# Patient Record
Sex: Female | Born: 1975
Health system: Southern US, Community
[De-identification: ages and names within clinical notes are randomized; demographics above are authoritative.]

## PROBLEM LIST (undated history)

## (undated) DIAGNOSIS — F419 Anxiety disorder, unspecified: Secondary | ICD-10-CM

## (undated) DIAGNOSIS — E669 Obesity, unspecified: Secondary | ICD-10-CM

## (undated) DIAGNOSIS — F329 Major depressive disorder, single episode, unspecified: Secondary | ICD-10-CM

## (undated) DIAGNOSIS — F32A Depression, unspecified: Secondary | ICD-10-CM

## (undated) HISTORY — DX: Major depressive disorder, single episode, unspecified: F32.9

## (undated) HISTORY — DX: Obesity, unspecified: E66.9

## (undated) HISTORY — DX: Depression, unspecified: F32.A

## (undated) HISTORY — DX: Anxiety disorder, unspecified: F41.9

---

## 1999-08-06 ENCOUNTER — Ambulatory Visit (HOSPITAL_COMMUNITY): Admission: RE | Admit: 1999-08-06 | Discharge: 1999-08-06 | Payer: Self-pay | Admitting: *Deleted

## 1999-08-06 ENCOUNTER — Encounter: Payer: Self-pay | Admitting: *Deleted

## 1999-09-04 ENCOUNTER — Inpatient Hospital Stay (HOSPITAL_COMMUNITY): Admission: AD | Admit: 1999-09-04 | Discharge: 1999-09-04 | Payer: Self-pay | Admitting: *Deleted

## 1999-12-07 ENCOUNTER — Inpatient Hospital Stay (HOSPITAL_COMMUNITY): Admission: AD | Admit: 1999-12-07 | Discharge: 1999-12-10 | Payer: Self-pay | Admitting: *Deleted

## 1999-12-07 ENCOUNTER — Encounter (INDEPENDENT_AMBULATORY_CARE_PROVIDER_SITE_OTHER): Payer: Self-pay

## 2001-11-21 ENCOUNTER — Other Ambulatory Visit: Admission: RE | Admit: 2001-11-21 | Discharge: 2001-11-21 | Payer: Self-pay | Admitting: Family Medicine

## 2002-02-28 HISTORY — PX: ABDOMINAL HYSTERECTOMY: SHX81

## 2002-02-28 HISTORY — PX: UNILATERAL SALPINGECTOMY: SHX6160

## 2002-03-13 ENCOUNTER — Encounter (INDEPENDENT_AMBULATORY_CARE_PROVIDER_SITE_OTHER): Payer: Self-pay | Admitting: Specialist

## 2002-03-13 ENCOUNTER — Observation Stay (HOSPITAL_COMMUNITY): Admission: RE | Admit: 2002-03-13 | Discharge: 2002-03-14 | Payer: Self-pay | Admitting: Gynecology

## 2004-01-05 ENCOUNTER — Ambulatory Visit: Payer: Self-pay | Admitting: Family Medicine

## 2004-02-29 HISTORY — PX: DENTAL SURGERY: SHX609

## 2004-05-31 ENCOUNTER — Ambulatory Visit: Payer: Self-pay | Admitting: Family Medicine

## 2004-10-22 ENCOUNTER — Ambulatory Visit: Payer: Self-pay | Admitting: Internal Medicine

## 2004-11-30 ENCOUNTER — Ambulatory Visit: Payer: Self-pay | Admitting: Internal Medicine

## 2004-12-14 ENCOUNTER — Ambulatory Visit: Payer: Self-pay | Admitting: Internal Medicine

## 2004-12-20 ENCOUNTER — Other Ambulatory Visit: Admission: RE | Admit: 2004-12-20 | Discharge: 2004-12-20 | Payer: Self-pay | Admitting: Gynecology

## 2005-03-22 ENCOUNTER — Ambulatory Visit: Payer: Self-pay | Admitting: Internal Medicine

## 2005-06-06 ENCOUNTER — Ambulatory Visit: Payer: Self-pay | Admitting: Family Medicine

## 2005-08-22 ENCOUNTER — Ambulatory Visit: Payer: Self-pay | Admitting: Family Medicine

## 2005-09-23 ENCOUNTER — Ambulatory Visit: Payer: Self-pay | Admitting: *Deleted

## 2005-11-01 ENCOUNTER — Emergency Department (HOSPITAL_COMMUNITY): Admission: EM | Admit: 2005-11-01 | Discharge: 2005-11-01 | Payer: Self-pay | Admitting: Emergency Medicine

## 2006-01-04 ENCOUNTER — Ambulatory Visit: Payer: Self-pay | Admitting: *Deleted

## 2006-01-11 ENCOUNTER — Ambulatory Visit: Payer: Self-pay | Admitting: *Deleted

## 2006-01-18 ENCOUNTER — Ambulatory Visit: Payer: Self-pay | Admitting: *Deleted

## 2006-01-20 ENCOUNTER — Ambulatory Visit: Payer: Self-pay | Admitting: *Deleted

## 2006-01-27 ENCOUNTER — Ambulatory Visit: Payer: Self-pay | Admitting: *Deleted

## 2006-02-08 ENCOUNTER — Ambulatory Visit: Payer: Self-pay | Admitting: *Deleted

## 2006-03-01 ENCOUNTER — Ambulatory Visit: Payer: Self-pay | Admitting: *Deleted

## 2006-03-08 ENCOUNTER — Ambulatory Visit: Payer: Self-pay | Admitting: Family Medicine

## 2006-03-08 ENCOUNTER — Ambulatory Visit: Payer: Self-pay | Admitting: *Deleted

## 2006-03-15 ENCOUNTER — Ambulatory Visit: Payer: Self-pay | Admitting: *Deleted

## 2006-10-03 ENCOUNTER — Encounter: Payer: Self-pay | Admitting: Family Medicine

## 2006-11-22 ENCOUNTER — Telehealth: Payer: Self-pay | Admitting: Infectious Diseases

## 2006-12-07 ENCOUNTER — Telehealth (INDEPENDENT_AMBULATORY_CARE_PROVIDER_SITE_OTHER): Payer: Self-pay | Admitting: *Deleted

## 2006-12-08 ENCOUNTER — Ambulatory Visit: Payer: Self-pay | Admitting: Internal Medicine

## 2006-12-08 DIAGNOSIS — F329 Major depressive disorder, single episode, unspecified: Secondary | ICD-10-CM

## 2006-12-08 DIAGNOSIS — F419 Anxiety disorder, unspecified: Secondary | ICD-10-CM

## 2006-12-11 ENCOUNTER — Telehealth (INDEPENDENT_AMBULATORY_CARE_PROVIDER_SITE_OTHER): Payer: Self-pay | Admitting: *Deleted

## 2006-12-13 ENCOUNTER — Telehealth (INDEPENDENT_AMBULATORY_CARE_PROVIDER_SITE_OTHER): Payer: Self-pay | Admitting: *Deleted

## 2006-12-19 ENCOUNTER — Telehealth (INDEPENDENT_AMBULATORY_CARE_PROVIDER_SITE_OTHER): Payer: Self-pay | Admitting: *Deleted

## 2006-12-26 ENCOUNTER — Encounter: Payer: Self-pay | Admitting: Internal Medicine

## 2006-12-27 ENCOUNTER — Encounter: Payer: Self-pay | Admitting: Internal Medicine

## 2007-01-24 ENCOUNTER — Telehealth (INDEPENDENT_AMBULATORY_CARE_PROVIDER_SITE_OTHER): Payer: Self-pay | Admitting: *Deleted

## 2007-01-29 ENCOUNTER — Encounter: Payer: Self-pay | Admitting: Family Medicine

## 2007-01-29 ENCOUNTER — Telehealth (INDEPENDENT_AMBULATORY_CARE_PROVIDER_SITE_OTHER): Payer: Self-pay | Admitting: *Deleted

## 2007-02-26 ENCOUNTER — Telehealth (INDEPENDENT_AMBULATORY_CARE_PROVIDER_SITE_OTHER): Payer: Self-pay | Admitting: *Deleted

## 2007-03-01 HISTORY — PX: CARPAL TUNNEL RELEASE: SHX101

## 2007-03-29 ENCOUNTER — Telehealth (INDEPENDENT_AMBULATORY_CARE_PROVIDER_SITE_OTHER): Payer: Self-pay | Admitting: *Deleted

## 2007-04-20 ENCOUNTER — Telehealth (INDEPENDENT_AMBULATORY_CARE_PROVIDER_SITE_OTHER): Payer: Self-pay | Admitting: *Deleted

## 2007-05-15 ENCOUNTER — Telehealth (INDEPENDENT_AMBULATORY_CARE_PROVIDER_SITE_OTHER): Payer: Self-pay | Admitting: *Deleted

## 2007-05-18 ENCOUNTER — Emergency Department (HOSPITAL_COMMUNITY): Admission: EM | Admit: 2007-05-18 | Discharge: 2007-05-18 | Payer: Self-pay | Admitting: Emergency Medicine

## 2007-06-06 ENCOUNTER — Telehealth (INDEPENDENT_AMBULATORY_CARE_PROVIDER_SITE_OTHER): Payer: Self-pay | Admitting: *Deleted

## 2007-06-06 ENCOUNTER — Encounter (INDEPENDENT_AMBULATORY_CARE_PROVIDER_SITE_OTHER): Payer: Self-pay | Admitting: *Deleted

## 2007-06-07 ENCOUNTER — Telehealth (INDEPENDENT_AMBULATORY_CARE_PROVIDER_SITE_OTHER): Payer: Self-pay | Admitting: *Deleted

## 2007-07-04 ENCOUNTER — Encounter: Payer: Self-pay | Admitting: Family Medicine

## 2007-07-09 ENCOUNTER — Ambulatory Visit: Payer: Self-pay | Admitting: Internal Medicine

## 2007-07-24 ENCOUNTER — Telehealth: Payer: Self-pay | Admitting: Internal Medicine

## 2007-07-27 ENCOUNTER — Ambulatory Visit: Payer: Self-pay | Admitting: Internal Medicine

## 2007-07-27 DIAGNOSIS — N951 Menopausal and female climacteric states: Secondary | ICD-10-CM

## 2007-08-01 ENCOUNTER — Encounter (INDEPENDENT_AMBULATORY_CARE_PROVIDER_SITE_OTHER): Payer: Self-pay | Admitting: *Deleted

## 2007-08-06 ENCOUNTER — Telehealth (INDEPENDENT_AMBULATORY_CARE_PROVIDER_SITE_OTHER): Payer: Self-pay | Admitting: *Deleted

## 2007-08-07 ENCOUNTER — Telehealth: Payer: Self-pay | Admitting: Internal Medicine

## 2007-08-15 ENCOUNTER — Ambulatory Visit: Payer: Self-pay | Admitting: Internal Medicine

## 2007-08-15 ENCOUNTER — Telehealth (INDEPENDENT_AMBULATORY_CARE_PROVIDER_SITE_OTHER): Payer: Self-pay | Admitting: *Deleted

## 2007-09-18 ENCOUNTER — Telehealth (INDEPENDENT_AMBULATORY_CARE_PROVIDER_SITE_OTHER): Payer: Self-pay | Admitting: *Deleted

## 2007-09-19 ENCOUNTER — Telehealth: Payer: Self-pay | Admitting: Internal Medicine

## 2007-09-19 ENCOUNTER — Telehealth (INDEPENDENT_AMBULATORY_CARE_PROVIDER_SITE_OTHER): Payer: Self-pay | Admitting: *Deleted

## 2007-10-02 ENCOUNTER — Telehealth (INDEPENDENT_AMBULATORY_CARE_PROVIDER_SITE_OTHER): Payer: Self-pay | Admitting: *Deleted

## 2007-10-03 ENCOUNTER — Telehealth (INDEPENDENT_AMBULATORY_CARE_PROVIDER_SITE_OTHER): Payer: Self-pay | Admitting: *Deleted

## 2007-10-04 ENCOUNTER — Ambulatory Visit: Payer: Self-pay | Admitting: Internal Medicine

## 2007-11-07 ENCOUNTER — Telehealth: Payer: Self-pay | Admitting: Internal Medicine

## 2008-05-02 ENCOUNTER — Ambulatory Visit: Payer: Self-pay | Admitting: Internal Medicine

## 2008-05-02 ENCOUNTER — Telehealth: Payer: Self-pay | Admitting: Internal Medicine

## 2008-05-05 ENCOUNTER — Telehealth: Payer: Self-pay | Admitting: Internal Medicine

## 2008-05-16 ENCOUNTER — Ambulatory Visit: Payer: Self-pay | Admitting: Internal Medicine

## 2008-05-19 ENCOUNTER — Telehealth: Payer: Self-pay | Admitting: Internal Medicine

## 2008-05-19 LAB — CONVERTED CEMR LAB
ALT: 18 units/L (ref 0–35)
Albumin: 4.4 g/dL (ref 3.5–5.2)
BUN: 7 mg/dL (ref 6–23)
Basophils Absolute: 0.1 10*3/uL (ref 0.0–0.1)
Basophils Relative: 0.7 % (ref 0.0–3.0)
Bilirubin, Direct: 0.1 mg/dL (ref 0.0–0.3)
CO2: 23 meq/L (ref 19–32)
Calcium: 9.2 mg/dL (ref 8.4–10.5)
Creatinine, Ser: 0.6 mg/dL (ref 0.4–1.2)
HCT: 41.5 % (ref 36.0–46.0)
Hemoglobin: 14.7 g/dL (ref 12.0–15.0)
Lymphs Abs: 3.8 10*3/uL (ref 0.7–4.0)
MCHC: 35.5 g/dL (ref 30.0–36.0)
Monocytes Relative: 6 % (ref 3.0–12.0)
Neutro Abs: 6 10*3/uL (ref 1.4–7.7)
RBC: 4.53 M/uL (ref 3.87–5.11)
RDW: 12.3 % (ref 11.5–14.6)
TSH: 2.66 microintl units/mL (ref 0.35–5.50)
Total Protein: 6.7 g/dL (ref 6.0–8.3)

## 2008-05-23 ENCOUNTER — Telehealth: Payer: Self-pay | Admitting: Internal Medicine

## 2008-06-02 ENCOUNTER — Telehealth (INDEPENDENT_AMBULATORY_CARE_PROVIDER_SITE_OTHER): Payer: Self-pay | Admitting: *Deleted

## 2008-06-03 ENCOUNTER — Telehealth: Payer: Self-pay | Admitting: Internal Medicine

## 2008-06-03 ENCOUNTER — Encounter: Payer: Self-pay | Admitting: *Deleted

## 2008-06-03 ENCOUNTER — Ambulatory Visit: Payer: Self-pay | Admitting: Cardiovascular Disease

## 2008-06-03 ENCOUNTER — Emergency Department (HOSPITAL_COMMUNITY): Admission: EM | Admit: 2008-06-03 | Discharge: 2008-06-03 | Payer: Self-pay | Admitting: Emergency Medicine

## 2008-06-04 ENCOUNTER — Ambulatory Visit: Payer: Self-pay | Admitting: Internal Medicine

## 2008-06-04 LAB — CONVERTED CEMR LAB
Bilirubin Urine: NEGATIVE
Glucose, Urine, Semiquant: NEGATIVE
Ketones, urine, test strip: NEGATIVE
RBC / HPF: NONE SEEN (ref <3)
Specific Gravity, Urine: 1.01
Urobilinogen, UA: 0.2
WBC, UA: NONE SEEN cells/hpf (ref <3)
pH: 5.5

## 2008-06-05 ENCOUNTER — Encounter: Payer: Self-pay | Admitting: Internal Medicine

## 2008-06-09 ENCOUNTER — Telehealth (INDEPENDENT_AMBULATORY_CARE_PROVIDER_SITE_OTHER): Payer: Self-pay | Admitting: *Deleted

## 2008-06-12 ENCOUNTER — Ambulatory Visit: Payer: Self-pay | Admitting: Internal Medicine

## 2008-06-17 ENCOUNTER — Encounter: Payer: Self-pay | Admitting: Internal Medicine

## 2008-06-17 ENCOUNTER — Ambulatory Visit: Payer: Self-pay | Admitting: Internal Medicine

## 2008-06-18 ENCOUNTER — Telehealth: Payer: Self-pay | Admitting: Internal Medicine

## 2008-06-18 ENCOUNTER — Telehealth (INDEPENDENT_AMBULATORY_CARE_PROVIDER_SITE_OTHER): Payer: Self-pay | Admitting: *Deleted

## 2008-06-19 ENCOUNTER — Encounter: Payer: Self-pay | Admitting: Internal Medicine

## 2008-06-19 ENCOUNTER — Telehealth (INDEPENDENT_AMBULATORY_CARE_PROVIDER_SITE_OTHER): Payer: Self-pay | Admitting: *Deleted

## 2008-06-20 ENCOUNTER — Telehealth: Payer: Self-pay | Admitting: Internal Medicine

## 2008-06-26 ENCOUNTER — Telehealth: Payer: Self-pay | Admitting: Internal Medicine

## 2008-09-26 ENCOUNTER — Telehealth (INDEPENDENT_AMBULATORY_CARE_PROVIDER_SITE_OTHER): Payer: Self-pay | Admitting: *Deleted

## 2008-10-28 ENCOUNTER — Telehealth (INDEPENDENT_AMBULATORY_CARE_PROVIDER_SITE_OTHER): Payer: Self-pay | Admitting: *Deleted

## 2008-10-30 ENCOUNTER — Telehealth: Payer: Self-pay | Admitting: Internal Medicine

## 2008-10-30 ENCOUNTER — Ambulatory Visit: Payer: Self-pay | Admitting: Internal Medicine

## 2008-11-28 ENCOUNTER — Telehealth (INDEPENDENT_AMBULATORY_CARE_PROVIDER_SITE_OTHER): Payer: Self-pay | Admitting: *Deleted

## 2008-12-19 ENCOUNTER — Ambulatory Visit: Payer: Self-pay | Admitting: Internal Medicine

## 2008-12-19 DIAGNOSIS — N644 Mastodynia: Secondary | ICD-10-CM

## 2008-12-22 ENCOUNTER — Ambulatory Visit: Payer: Self-pay | Admitting: Internal Medicine

## 2009-01-02 ENCOUNTER — Encounter: Admission: RE | Admit: 2009-01-02 | Discharge: 2009-01-02 | Payer: Self-pay | Admitting: Internal Medicine

## 2009-02-04 ENCOUNTER — Telehealth: Payer: Self-pay | Admitting: Family

## 2009-02-04 ENCOUNTER — Ambulatory Visit: Payer: Self-pay | Admitting: Family

## 2009-02-11 ENCOUNTER — Telehealth: Payer: Self-pay | Admitting: Internal Medicine

## 2009-03-18 ENCOUNTER — Telehealth (INDEPENDENT_AMBULATORY_CARE_PROVIDER_SITE_OTHER): Payer: Self-pay | Admitting: *Deleted

## 2009-03-30 ENCOUNTER — Telehealth: Payer: Self-pay | Admitting: Internal Medicine

## 2009-06-01 ENCOUNTER — Telehealth (INDEPENDENT_AMBULATORY_CARE_PROVIDER_SITE_OTHER): Payer: Self-pay | Admitting: *Deleted

## 2009-06-02 ENCOUNTER — Ambulatory Visit: Payer: Self-pay | Admitting: Internal Medicine

## 2009-06-02 ENCOUNTER — Telehealth (INDEPENDENT_AMBULATORY_CARE_PROVIDER_SITE_OTHER): Payer: Self-pay | Admitting: *Deleted

## 2009-06-03 ENCOUNTER — Encounter: Payer: Self-pay | Admitting: Internal Medicine

## 2009-10-14 ENCOUNTER — Ambulatory Visit: Payer: Self-pay | Admitting: Internal Medicine

## 2009-10-14 DIAGNOSIS — J019 Acute sinusitis, unspecified: Secondary | ICD-10-CM

## 2010-03-28 LAB — CONVERTED CEMR LAB
Basophils Relative: 0.7 % (ref 0.0–1.0)
CO2: 31 meq/L (ref 19–32)
Eosinophils Relative: 3.7 % (ref 0.0–5.0)
GFR calc Af Amer: 186 mL/min
Glucose, Bld: 93 mg/dL (ref 70–99)
HCT: 38 % (ref 36.0–46.0)
Hemoglobin: 13.4 g/dL (ref 12.0–15.0)
Lymphocytes Relative: 29.8 % (ref 12.0–46.0)
Monocytes Absolute: 0.5 10*3/uL (ref 0.2–0.7)
Neutro Abs: 5.7 10*3/uL (ref 1.4–7.7)
Potassium: 4.1 meq/L (ref 3.5–5.1)
WBC: 9.4 10*3/uL (ref 4.5–10.5)

## 2010-04-01 NOTE — Assessment & Plan Note (Signed)
Summary: SINUS INFECTION/KN   Vital Signs:  Patient profile:   35 year old female Menstrual status:  hysterectomy Weight:      234.13 pounds Pulse rate:   75 / minute Pulse rhythm:   regular BP sitting:   132 / 80  (left arm) Cuff size:   large  Vitals Entered By: Army Fossa CMA (October 14, 2009 10:28 AM) CC: Sinus Infection? Comments - sneezing - coughing - head congestion - Fever of 100.8 (highest)    History of Present Illness: sick for one week Sinus congestion, bloody nasal discharge, frontal headache that gets worse when she bends. Some left ear discomfort  ROS No sore throat Some cough with green sputum No shortness or breath No nausea, vomiting or diarrhea No myalgias she was seen several months ago with mastalgia, she had a negative mammogram, symptoms resolved. She does self breast exam and found no abnormalities  Current Medications (verified): 1)  Celexa 40 Mg  Tabs (Citalopram Hydrobromide) .Marland Kitchen.. 1 1/2 By Mouth Once Daily 2)  Alprazolam 1 Mg  Tabs (Alprazolam) .Marland Kitchen.. 1 By Mouth Three Times A Day As Needed Fax [phone]  Allergies: 1)  ! Codeine 2)  ! Ultram (Tramadol Hcl) 3)  ! * Contrast Dye 4)  ! Hydrocodone  Past History:  Past Medical History: Reviewed history from 06/12/2008 and no changes required. Anxiety Depression Obesity  Past Surgical History: Reviewed history from 06/12/2008 and no changes required. Hysterectomy Oophorectomy (only 1 was removed at time of hysterectomy) Carpal tunnel syndrome- surgery on the R 4-09  Physical Exam  General:  alert and well-developed.   Head:  face symmetric, tender at both maxillary and frontal sinuses bilaterally Ears:  R ear normal and L ear normal.   Nose:  congested Mouth:  no redness or discharge Lungs:  clear to auscultation bilaterally Heart:  normal rate, regular rhythm, no murmur, and no gallop.     Impression & Recommendations:  Problem # 1:  SINUSITIS - ACUTE-NOS  (ICD-461.9)  see  instructions  Her updated medication list for this problem includes:    Amoxicillin 500 Mg Caps (Amoxicillin) .Marland Kitchen... 2 capsules by mouth twice a day for 10 days    Flonase 50 Mcg/act Susp (Fluticasone propionate) .Marland Kitchen... 2 sprays daily to each side of the nose  Problem # 2:  MASTALGIA (ICD-611.71) symptoms resolved Had a mammogram 11-10, was negative  Complete Medication List: 1)  Celexa 40 Mg Tabs (Citalopram hydrobromide) .Marland Kitchen.. 1 1/2 by mouth once daily 2)  Alprazolam 1 Mg Tabs (Alprazolam) .Marland Kitchen.. 1 by mouth three times a day as needed fax [phone] 3)  Amoxicillin 500 Mg Caps (Amoxicillin) .... 2 capsules by mouth twice a day for 10 days 4)  Flonase 50 Mcg/act Susp (Fluticasone propionate) .... 2 sprays daily to each side of the nose  Patient Instructions: 1)  rest, fluids 2)  Continue taking ibuprofen and alavert as needed 3)  Mucinex DM twice a day until the cough is resolved 4)  Flonase 2 sprays in each side of the nose for 4 weeks 5)  amoxicillin as prescribed 6)  Call if you're not completely well in 2 weeks, call any time if you get worse 7)  you are due for a physical exam, please schedule at your convenience Prescriptions: FLONASE 50 MCG/ACT SUSP (FLUTICASONE PROPIONATE) 2 sprays daily to each side of the nose  #1 x 0   Entered and Authorized by:   Nolon Rod. Paz MD   Signed by:  Jose E. Paz MD on 10/14/2009   Method used:   Electronically to        [id number] (retail)       [address]       [place], [place]  [address]       Ph: [phone] or [phone]       Fax: [phone]   RxID:   [id number] AMOXICILLIN 500 MG CAPS (AMOXICILLIN) 2 capsules by mouth twice a day for 10 days  #40 x 0   Entered and Authorized by:   Nolon Rod. Paz MD   Signed by:   Nolon Rod. Paz MD on 10/14/2009   Method used:   Electronically to        [id number] (retail)       [address]       [place], [place]  [address]       Ph: [phone] or [phone]       Fax: [phone]   RxID:   [id number]

## 2010-04-01 NOTE — Progress Notes (Signed)
Summary: rf xanax  Phone Note Refill Request Call back at Home Phone [phone] Message from:  Patient on March 30, 2009 4:34 PM  Refills Requested: Medication #1:  ALPRAZOLAM 1 MG  TABS 1 by mouth three times a day as needed   Last Refilled: 12/29/2008   Notes: #45 with 6 refils [facility] [address]   Method Requested: Fax to Local Pharmacy Initial call taken by: Shary Decamp,  March 30, 2009 4:34 PM Caller: Patient  Follow-up for Phone Call        last ov 02/04/09, last refill #90 x 1 pm 01/27/09 Follow-up by: Kandice Hams,  March 30, 2009 4:43 PM  Additional Follow-up for Phone Call Additional follow up Details #1::        ok 90 and 1 RF Additional Follow-up by: Jose E. Paz MD,  March 30, 2009 5:03 PM    Prescriptions: ALPRAZOLAM 1 MG  TABS (ALPRAZOLAM) 1 by mouth three times a day as needed  #90 x 1   Entered by:   Shary Decamp   Authorized by:   Nolon Rod. Paz MD   Signed by:   Shary Decamp on 03/30/2009   Method used:   Printed then faxed to ...       [facility]  [address] [id number] (retail)       [address]       [place], [place]  [address]       Ph: [phone] or [phone]       Fax: [phone]   RxID:   [id number]

## 2010-04-01 NOTE — Progress Notes (Signed)
  Phone Note Call from Patient Call back at [facility] Phone [phone]   Caller: Patient Summary of Call: pt called left msg wanted a called back. Called  pt says she had vomiting last thurs, which is gone now , but diarrhea now x 4 days, Pt is drinking fluids, wanted to know what to do for diarrhea. Recommed Imodium AD if no better may need ov, pt agreed .Kandice Hams  March 18, 2009 12:27 PM  Initial call taken by: Kandice Hams,  March 18, 2009 12:27 PM

## 2010-04-01 NOTE — Progress Notes (Signed)
Summary: XRAY REQUEST  Phone Note Call from Patient Call back at Home Phone [phone]   Caller: Patient Call For: [place] E. Paz MD Summary of Call: PATIENT CALLING, STATES SHE IS SURE SHE HAS BROKEN HER LEFT FIRST BIG TOE.  UNABLE TO COME IN FOR OV TODAY, BUT WANTS TO KNOW IF DR. PAZ WILL SEND HER FOR AN XRAY. Initial call taken by: Magdalen Spatz [facility],  June 01, 2009 11:07 AM  Follow-up for Phone Call        pt was walking down steps and slipper got stuck under toe, now unable to walk flat. Informed pt will need ov to assess. ov scheduled .Kandice Hams  June 01, 2009 11:56 AM  Follow-up by: Kandice Hams,  June 01, 2009 11:56 AM

## 2010-04-01 NOTE — Progress Notes (Signed)
Summary: itching  Phone Note Call from Patient Call back at [facility] Phone [phone]   Summary of Call: Took 1 vicodin -- c/o of severe itching Advised pt to take benadryl, hydrocodone added to allergy list rx something else? Shary Decamp  June 02, 2009 11:42 AM   Follow-up for Phone Call        --will have to stick to Tylenol 500 mg two tablets every 6 hours as needed -- she does have a fracture, joint is involved, please refer to ortho; until then, she needs to use very hard/sturdy shoes or come back and pick up a postop shoe Follow-up by: Nolon Rod. Paz MD,  June 02, 2009 3:03 PM  Additional Follow-up for Phone Call Additional follow up Details #1::        discussed with pt Shary Decamp  June 02, 2009 4:41 PM   New Problems: FRACTURE, FOOT (ICD-825.20)   New Problems: FRACTURE, FOOT (ICD-825.20)

## 2010-04-01 NOTE — Assessment & Plan Note (Signed)
Summary: RIGHT BIG TOE BROKEN?/ALR   Vital Signs:  Patient profile:   35 year old female Menstrual status:  hysterectomy Height:      67 inches Weight:      242 pounds Pulse rate:   70 / minute BP sitting:   120 / 80  Vitals Entered By: Shary Decamp (June 02, 2009 9:21 AM) CC: fell down steps 5 days ago, injury to left great toe, foot   History of Present Illness: 5 days ago she was going down the stairs, she tripped and landed on her left big toe,she hyperflexed it  complaining of moderate to severe pain swelling is about the same since the accident  Current Medications (verified): 1)  Celexa 40 Mg  Tabs (Citalopram Hydrobromide) .Marland Kitchen.. 1 1/2 By Mouth Once Daily 2)  Alprazolam 1 Mg  Tabs (Alprazolam) .Marland Kitchen.. 1 By Mouth Three Times A Day As Needed  Allergies (verified): 1)  ! Codeine 2)  ! Ultram (Tramadol Hcl) 3)  ! * Contrast Dye 4)  ! Hydrocodone  Past History:  Past Medical History: Reviewed history from 06/12/2008 and no changes required. Anxiety Depression Obesity  Past Surgical History: Reviewed history from 06/12/2008 and no changes required. 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 lost father Carlton Adam one of my patients) aprox 2-11  Review of Systems       denies any other injuries she lost her father few weeks ago, still grieving, she is counseled  Physical Exam  General:  alert and well-developed.   Extremities:  --right foot normal --left foot good capillary refill swelling and tenderness at the L great toe, some ecchymoses, no deformity   Impression & Recommendations:  Problem # 1:  FOOT PAIN, LEFT (ICD-729.5) suspect left great toe fracture, will do an x-ray, depending on the results she may need to see ortho prescribe Vicodin for pain  control, in the past she tolerated it well although sometimes has itchiness with Vicodin.  Advised to let me know if she has any severe reaction Orders: T-Foot Left Min 3 Views (73630TC)  Problem # 2:  DEPRESSION (ICD-311) also lost her father not long ago, counseled Her updated medication list for this problem includes:    Celexa 40 Mg Tabs (Citalopram hydrobromide) .Marland Kitchen... 1 1/2 by mouth once daily    Alprazolam 1 Mg Tabs (Alprazolam) .Marland Kitchen... 1 by mouth three times a day as needed  Complete Medication List: 1)  Celexa 40 Mg Tabs (Citalopram hydrobromide) .Marland Kitchen.. 1 1/2 by mouth once daily 2)  Alprazolam 1 Mg Tabs (Alprazolam) .Marland Kitchen.. 1 by mouth three times a day as needed 3)  Hydrocodone-acetaminophen 7.5-750 Mg Tabs (Hydrocodone-acetaminophen) .Marland Kitchen.. 1 by mouth three times a day as needed Prescriptions: HYDROCODONE-ACETAMINOPHEN 7.5-750 MG TABS (HYDROCODONE-ACETAMINOPHEN) 1 by mouth three times a day as needed  #30 x 0   Entered and Authorized by:   Elita Quick E. Anahita Cua MD   Signed by:   Nolon Rod. Dehaven Sine MD on 06/02/2009   Method used:   Print then Give to Patient   RxID:   [id number]

## 2010-04-01 NOTE — Consult Note (Signed)
Summary: [facility]  [facility]   Imported By: Lanelle Bal 06/10/2009 13:41:44  _____________________________________________________________________  External Attachment:    Type:   Image     Comment:   External Document

## 2010-07-16 NOTE — Op Note (Signed)
NAME:  Kylie Munoz, Kylie Munoz NO.:  [id number]   MEDICAL RECORD NO.:  [id number]                   PATIENT TYPE:  OBV   LOCATION:  0479                                 FACILITY:  [facility]   PHYSICIAN:  Gretta Cool, M.D.              DATE OF BIRTH:  12/20/1975   DATE OF PROCEDURE:  DATE OF DISCHARGE:                                 OPERATIVE REPORT   PREOPERATIVE DIAGNOSES:  1. Incapacitating cyclic pelvic pain.  2. Abnormal uterine bleeding.   POSTOPERATIVE DIAGNOSES:  1. Incapacitating cyclic pelvic pain.  2. Abnormal uterine bleeding.  3. Surgically absent right tube and ovary from previous procedure.  4. Endometriosis involving the cesarean section scar.   PROCEDURE:  1. Diagnostic laparoscopy.  2. Laparoscopically assisted vaginal hysterectomy.  3. Lysis of significant adhesions.   SURGEON:  Gretta Cool, M.D.   ASSISTANT:  Raynald Kemp, M.D.   ANESTHESIA:  General orotracheal.   DESCRIPTION OF PROCEDURE:  Under excellent general anesthesia with the  patient prepped and draped in Allen stirrups with Hulka tenaculum applied to  cervix and the bladder drained, a subumbilical incision was made and Veress  cannula introduced.  After adequate pneumoperitoneum the laparoscope trocar  was introduced and pelvic organs visualized.  There were significant  adhesions from her previous cesarean section.  Careful examination revealed  previous tubal sterilization on the left with a prominent left ovary  polycystic in appearance.  The right ovary was surgically absent as was the  fallopian tube.  There were adhesions all over the anterior wall of the  uterus at the site of previous cesarean section all the way to the round  ligaments.  These adhesions were lysed by tripolar forceps.  Two accessory  ports were placed under direct vision.  There was some bleeding at the right  port that was controlled by deep suture in the fascia.  The pedicles were  then transected from the adnexal pedicles to the level of the uterine  vessels by tripolar forceps.  On the left the ovarian ligament was  transected and dissection was carried to the level of the uterine vessels.  The vesicovaginal plica was dissected from above.  At this point with  bleeding well controlled, attention was turned to the vaginal portion of  procedure.  Cervix was grasped with a single tooth tenaculum, pulled down in  view.  Was infiltrated with Xylocaine with epinephrine 1:200,000.  The  mucosa was then incised and the bladder pushed off the lower segment.  The  cul-de-sac was then entered and the uterosacral ligaments clamped, cut,  sutured, and tied with 0 Vicryl.  The cardinal ligaments were then clamped,  cut, sutured, and tied with 0 Vicryl.  The uterine vessels were then  skeletonized, clamped, cut, sutured, and tied with 0 Vicryl.  At this point  the pedicles were communicated with the previous abdominal  laparoscopic  portion of the procedure.  The pedicle was then tied and the uterus removed.  At this point the pedicles were examined and were all clean and dry.  The  pelvic peritoneum was then closed with a running suture of number 0  Monocryl.  The cardinal and uterosacral complex was then secured with 0  Ethibond to the anterior/posterior vaginal mucosa and to the angle of the  vagina.  The fascia was then approximated with a running suture of 2-0  Vicryl so as to approximate the entire end of pelvic fascia and create a  complete envelope with fascia, three at a time, across the apex to the  vaginal cuff.  At this point the bleeding was well controlled.  The  reexamination of the cuff as above revealed no evidence of any vaginal  bleeding.  The pelvis was irrigated with lactated Ringer's and approximately  500 cubic centimeters left in the pelvis.  The incisions were then closed  with deep suture of 5-0 Dexon and skin closed with Steri-Strips.  At the end  of the  procedure sponge and lap counts were correct.  There were no  complications.  The patient returned to recovery room in excellent  condition.   ADDENDUM:  The right ovary and fallopian tube could not be identified and  there was evidence of previous resection at a previous procedure.                                               Gretta Cool, M.D.    CWL/MEDQ  D:  03/13/2002  T:  03/13/2002  Job:  [id number]

## 2010-07-16 NOTE — H&P (Signed)
[facility]  Patient:    Kylie Munoz, Kylie Munoz                        MRN: [id number] Adm. Date:  12/07/99 Attending:  Deniece Ree, M.D.                         History and Physical  CHIEF COMPLAINT:              The patient is a 35 year old gravida 2 para 1 female, whose estimated date of confinement is December 14, 1999 by ultrasound and December 15, 1999 by dates.  HISTORY OF PRESENT ILLNESS:   The patient came to triage, at which time it was noted that she had rupture of membranes.  At the time of evaluation the patient was thought to be fingertip dilated, approximately 50% effaced, with vertex at -2 station. DD:  12/07/99 TD:  12/08/99 Job: [id number]

## 2010-07-16 NOTE — Op Note (Signed)
[facility]  Patient:    Kylie Munoz, Kylie Munoz                         MRN: [id number] Proc. Date: 12/07/99 Adm. Date:  [id number] Attending:  Deniece Ree                           Operative Report  PREOPERATIVE DIAGNOSIS:  Intrauterine pregnancy at term; breech presentation; multiparity, desire for permanent sterilization.  POSTOPERATIVE DIAGNOSIS:  Intrauterine pregnancy at term, breech presentation, multiparity, desire for permanent sterilization.  Viable female infant with Apgar of 9 at nine.  OPERATION:  Primary low transverse cesarean section; bilateral tubal ligation.  SURGEON:  Deniece Ree, M.D.  ANESTHESIA:  Epidural.  ESTIMATED BLOOD LOSS:  350 cc.  INDICATIONS:  The patient is a 35 year old gravida 2, para 1 who is admitted with rupture of membranes, who in the course of labor dilated to 5 cm and on re-examination was noted to have a breech presentation.  The estimated fetal weight at this time was at least 8 pounds.  She was then scheduled for a primary cesarean section.  PEDIATRICS:  Dr. Mikle Bosworth, Teaching Service.  DRAINS:  Foley left to straight drainage.  The patient tolerated the procedure well and returned to the recovery room in satisfactory condition.  DESCRIPTION OF PROCEDURE:  The patient was taken to the operating room and prepped and draped in the usual fashion for a cesarean section.  A low Pfannenstiel incision was made.  This was carried down to the fascia, at which time the fascia was entered and excised the extent of the incision.  The midline identified and rectus muscle separated.  The abdominal peritoneum was then entered in a vertical fashion using Metzenbaum scissors.  The visceral peritoneum was then excised bilaterally toward the round ligaments, following which the lower uterine segment was scored, entered in the midline and bluntly dissected open.  The right hand was introduced and a breech deliver was  then carried out without any difficulty.  The nasopharynx was then sucked out with a suction bulb.  The cord was clamped and the infant turned over to the pediatricians who were in attendance.  Cord blood was then obtained following which the placenta as well as all products of conception was then manually removed from the uterine cavity.  IV Pitocin as well as IV antibiotics were then begun.  The myometrium was then closed using #1 chromic in a running locking stitch followed by an imbricating stitch again using #1 chromic.  Reperitonealization was then carried out using 2-0 chromic in a running stitch.  At this time sponge and needle count was correct x 2 and hemostasis was present.  The left tube was then grasped with a Babcock clamp and followed out until the fimbriated end could be identified.  It was then knuckled up utilizing 0 plain catgut ligated in a modified Pomeroy procedure. This was done likewise on the right side.  Both tubal segment were later sent to pathology.  Both tubal stump areas were then cauterized with the use of cautery.  Again, hemostasis was present.  Sponge and needle count was correct x 2.  Ovaries appeared to be within normal limits.  The abdominal peritoneum was then closed with a 2-0 chromic in a running stitch followed by closure of the fascia using #1 Vicryl in a running stitch.  The skin was closed with skin  staples.  Procedure terminated.  The patient tolerated the procedure well and returned to the recovery room in satisfactory condition. DD:  12/07/99 TD:  12/09/99 Job: [id number]

## 2010-07-16 NOTE — H&P (Signed)
NAME:  Kylie Munoz, YARBRO NO.:  [id number]   MEDICAL RECORD NO.:  [id number]                   PATIENT TYPE:  OBV   LOCATION:  0479                                 FACILITY:  [facility]   PHYSICIAN:  Gretta Cool, M.D.              DATE OF BIRTH:  1975-08-28   DATE OF ADMISSION:  03/13/2002  DATE OF DISCHARGE:                                HISTORY & PHYSICAL   HISTORY OF PRESENT ILLNESS:  This is a 35 year old, gravida 2, para 2, with  history of cesarean section delivered for breech in 2001, and previous  vaginal delivery, now having incapacitating dysmenorrhea, premenstrual.  Also having extremely heavy menstrual bleeding with clots.  She has tried  conservative measures without significant benefit.  She wishes definitive  therapy by hysterectomy and salpingectomy.  She has declined other options  including hysteroscopy resection.  We discussed alternative therapies,  risks, benefits, and ratio involved.   PAST MEDICAL HISTORY:  Usual childhood disease without sequela.  Medical  illnesses:  None of consequence.  Accidents/injuries:  None.  Previous  hospitalizations:  Treated for gonorrhea age 64, cesarean section delivery  and one vaginal delivery 1997.  She also has a previous history of Chlamydia  and bacterial vaginosis.  No other significant medical history.   FAMILY HISTORY:  Mother and father are living, but with multiple medical  problems that she does not elaborate, but including arthritis, thyroid  problems, hypercholesterolemia, blood pressure and heart problems.   SOCIAL HISTORY:  Gravida 2, para 2 with one stepchild that is living with  her husband.  She is not employed outside the home.  He works for a [employer].   HABITS:  Denies ethanol.  Occasional tobacco use.  Denies recreational  drugs.   REVIEW OF SYSTEMS:  HEENT:  Denies __________.  RESPIRATORY:  Denies asthma,  cough, bronchitis, shortness of breath.   GASTROINTESTINAL/ GENITOURINARY:  Denies frequency, urgency, dysuria, change in bowel habits, food  intolerance.   PHYSICAL EXAMINATION:  GENERAL:  Well-developed, well-nourished white  female, but massively over ideal weight with high abdomen to hip ratio.  She  weighs 205 at 5 feet 6 inches.  HEENT:  Pupils are equal, round, and reactive to light and accommodation.  Fundi not examined.  Oropharynx clear.  NECK:  Supple without mass, thyroid enlargement.  CHEST:  Clear P:A.  HEART:  Regular rhythm without murmur, cardiac enlargement.  BREASTS:  Without mass, nodes, nipple discharge.  PELVIC:  External genitalia normal female.  Vagina clean, rugose.  Cervix is  parous, clean.  Uterus is upper limits normal size.  Adnexa clear.  Rectovaginal confirms.  There is moderate tenderness at the lower uterine  segment.  No masses.  EXTREMITIES:  Negative.  NEUROLOGICAL:  Physiologic.   IMPRESSION:  1. Incapacitating cyclic pelvic pain.  2. Menorrhagia.  3. History of tubal sterilization.  4. History  of gonorrhea/Chlamydia.   RECOMMENDATIONS:  I have discussed all alternative therapies including  hysteroscopy resection, ablation, oral contraceptives for suppression of her  abnormal bleeding and hopefully pain, not successful in the past.  I  discussed all options and alternatives, risks and benefits and she wishes to  proceed to definitive therapy recommendations on her plan.  On to  laparoscopy assisted vaginal hysterectomy, possible abdominal hysterectomy  and salpingo-oophorectomy.                                               Gretta Cool, M.D.    CWL/MEDQ  D:  03/13/2002  T:  03/13/2002  Job:  [id number]

## 2010-07-16 NOTE — Discharge Summary (Signed)
[facility]  Patient:    Kylie Munoz, Kylie Munoz                         MRN: [id number] Adm. Date:  [id number] Disc. Date: [id number] Attending:  Deniece Ree                           Discharge Summary  SUMMARY:                      Patient is a 35 year old female who was admitted and who was in active labor.  The patient had ruptured membranes at the time of admission.  On evaluation it was found that patient had a breech presentation.  The patient was also desiring permanent sterilization and therefore at that time a cesarean section was performed.  On October 9 patient underwent a primary low transverse cesarean section and a bilateral tubal ligation.  This patient tolerated this procedure very well without any problems.  Postoperatively she did good without any problems and was discharged on the third postoperative day.  She was instructed on the possible complications than can occur from this type of surgery.  She was told to return to my office in four weeks for follow-up evaluation or call me prior to that time should problems arise. DD:  01/19/00 TD:  01/21/00 Job: [id number]

## 2010-07-16 NOTE — H&P (Signed)
[facility]  Patient:    Kylie Munoz, Kylie Munoz                         MRN: [id number] Adm. Date:  [id number] Attending:  Deniece Ree                         History and Physical  CHIEF COMPLAINT:              The patient is a 35 year old gravida 2 para 1, whose estimated date of confinement by ultrasound is December 14, 1999 and by dates December 15, 1999.  HISTORY OF PRESENT ILLNESS:   The patient came to triage complaining of leakage of fluid, at which time she was evaluated and noted to have rupture of membranes.  The patient was having an occasional uterine contraction.  PAST MEDICAL HISTORY:         The patient has had one previous vaginal delivery.  The patient also is very adamant about having a tubal ligation. All of the different types of contraceptives was explained to her, which she understands, and along with her husband, and they do not wish any further pregnancies.  She also understands that this procedure for tubal ligation is intended to be permanent; however, cannot be guaranteed.  ALLERGIES:                    CODEINE.  PHYSICAL EXAMINATION:  GENERAL:                      Well-developed, well-nourished female, in no acute distress.  HEENT:                        Within normal limits.  NECK:                         Supple.  BREAST:                       Without masses, tenderness, or discharge.  CHEST:                        Lungs clear to auscultation and percussion.  HEART:                        Normal sinus rhythm without murmurs, rubs, or gallops.  ABDOMEN:                      Obese, term gravid, with fetal heart beat 132-148 in the left mid quadrant.  EXTREMITIES:                  Within normal limits.  NEUROLOGIC:                   Within normal limits.  PELVIC:                       Pelvic examination at this time revealed the cervix to be fingertip dilated, very difficult to identify the presenting part; however,  it was at -2 station.  PLAN:                         The plan was for admission. DD:  12/07/99  TD:  12/08/99 Job: [id number]

## 2010-08-02 ENCOUNTER — Other Ambulatory Visit: Payer: Self-pay | Admitting: Internal Medicine

## 2010-08-03 NOTE — Telephone Encounter (Signed)
Ok 90, no RF Also, advise pt: due for ROV, no further RF w/o OV

## 2011-08-31 ENCOUNTER — Ambulatory Visit (INDEPENDENT_AMBULATORY_CARE_PROVIDER_SITE_OTHER): Payer: BC Managed Care – PPO | Admitting: Internal Medicine

## 2011-08-31 VITALS — BP 122/78 | HR 63 | Temp 97.9°F | Ht 66.0 in | Wt 204.0 lb

## 2011-08-31 DIAGNOSIS — Z0000 Encounter for general adult medical examination without abnormal findings: Secondary | ICD-10-CM

## 2011-08-31 LAB — CBC WITH DIFFERENTIAL/PLATELET
Basophils Absolute: 0 10*3/uL (ref 0.0–0.1)
Eosinophils Relative: 3.1 % (ref 0.0–5.0)
HCT: 40.6 % (ref 36.0–46.0)
Hemoglobin: 13.8 g/dL (ref 12.0–15.0)
Lymphocytes Relative: 39.9 % (ref 12.0–46.0)
Lymphs Abs: 3.1 10*3/uL (ref 0.7–4.0)
Monocytes Relative: 4.6 % (ref 3.0–12.0)
Neutro Abs: 4.1 10*3/uL (ref 1.4–7.7)
Platelets: 208 10*3/uL (ref 150.0–400.0)
RDW: 12.9 % (ref 11.5–14.6)
WBC: 7.9 10*3/uL (ref 4.5–10.5)

## 2011-08-31 LAB — LIPID PANEL
Cholesterol: 253 mg/dL — ABNORMAL HIGH (ref 0–200)
HDL: 51.6 mg/dL (ref >39.00)
Total CHOL/HDL Ratio: 5
VLDL: 29 mg/dL (ref 0.0–40.0)

## 2011-08-31 LAB — COMPREHENSIVE METABOLIC PANEL
CO2: 26 mEq/L (ref 19–32)
Calcium: 9.2 mg/dL (ref 8.4–10.5)
Chloride: 106 mEq/L (ref 96–112)
Creatinine, Ser: 0.6 mg/dL (ref 0.4–1.2)
GFR: 122.91 mL/min (ref >60.00)
Glucose, Bld: 99 mg/dL (ref 70–99)
Total Bilirubin: 0.6 mg/dL (ref 0.3–1.2)

## 2011-08-31 LAB — LDL CHOLESTEROL, DIRECT: Direct LDL: 174.7 mg/dL

## 2011-08-31 MED ORDER — VARENICLINE TARTRATE 0.5 MG X 11 & 1 MG X 42 PO MISC: ORAL | Status: AC

## 2011-08-31 MED ORDER — VARENICLINE TARTRATE 1 MG PO TABS: 1.0000 mg | ORAL_TABLET | Freq: Two times a day (BID) | ORAL | Status: AC

## 2011-08-31 NOTE — Progress Notes (Signed)
  Subjective:    Patient ID: Huel Cote, female    DOB: 03-12-1975, 36 y.o.   MRN: [id number]  HPI CPX  Past Medical History: H/o Anxiety, Depression Obesity  Past Surgical History: Hysterectomy, Oophorectomy (only 1 was removed at time of hysterectomy) Carpal tunnel syndrome- surgery on the R 4-09  Family History: DM - F, M CAD - F (dx in his 72s) , M (dx in her 88s) HTN - F, M hyperlipidemia - M, F hypothyroid - M, F COPD - F breast Ca - M colon ca--no    Fibromyalgia - M  Social History: Married,accupation waitress,    3 kids Tobacco-- < 1ppd ETOH-- socially Diet- exercise---> improving  In the last few months    Review of Systems Emotionally doing okay, in the past she took SSRIs some alprazolam, currently not needing any medication. Denies any chest pain or shortness of breath No nausea, vomiting or diarrhea.     Objective:   Physical Exam  General -- alert, well-developed, and slt overweight appearing. No apparent distress.  Neck --no thyromegaly Lungs -- normal respiratory effort, no intercostal retractions, no accessory muscle use, and normal breath sounds.   Heart-- normal rate, regular rhythm, no murmur, and no gallop.   Abdomen--soft, non-tender, no distention, no masses, no HSM, no guarding, and no rigidity.   Extremities-- no pretibial edema bilaterally  Neurologic-- alert & oriented X3 and strength normal in all extremities. Psych-- Cognition and judgment appear intact. Alert and cooperative with normal attention span and concentration.  not anxious appearing and not depressed appearing.      Assessment & Plan:

## 2011-08-31 NOTE — Assessment & Plan Note (Addendum)
Tdap today Patient has a family history of heart disease, EKG today nsr  She smokes, she is motivated to quit, she wonders about Chantix, I think is a good option. How to take it, pros and cons discussed. Prescriptions issue. She has improved her diet and exercise lately, praised. Needs a referral to gynecology, refer to Dr. Lily Peer. She is taking a number of supplements including a colon cleanser, pt wonders if  is safe. I review the colon cleanser label I didn't see anything dangerous, I told her to take it for a limited amount of time.

## 2011-09-02 ENCOUNTER — Encounter: Payer: Self-pay | Admitting: Internal Medicine

## 2011-09-05 ENCOUNTER — Encounter: Payer: Self-pay | Admitting: Internal Medicine

## 2011-09-12 ENCOUNTER — Ambulatory Visit: Payer: BC Managed Care – PPO | Admitting: Gynecology

## 2011-09-20 ENCOUNTER — Encounter: Payer: Self-pay | Admitting: Gynecology

## 2011-09-20 ENCOUNTER — Ambulatory Visit (INDEPENDENT_AMBULATORY_CARE_PROVIDER_SITE_OTHER): Payer: BC Managed Care – PPO | Admitting: Gynecology

## 2011-09-20 ENCOUNTER — Ambulatory Visit: Payer: BC Managed Care – PPO | Admitting: Gynecology

## 2011-09-20 ENCOUNTER — Other Ambulatory Visit (HOSPITAL_COMMUNITY)
Admission: RE | Admit: 2011-09-20 | Discharge: 2011-09-20 | Disposition: A | Discharge status: Home / Self Care | Payer: BC Managed Care – PPO | Source: Ambulatory Visit | Attending: Gynecology | Admitting: Gynecology

## 2011-09-20 VITALS — BP 120/70 | Ht 66.25 in | Wt 208.0 lb

## 2011-09-20 DIAGNOSIS — R87810 Cervical high risk human papillomavirus (HPV) DNA test positive: Secondary | ICD-10-CM | POA: Insufficient documentation

## 2011-09-20 DIAGNOSIS — Z01419 Encounter for gynecological examination (general) (routine) without abnormal findings: Secondary | ICD-10-CM | POA: Insufficient documentation

## 2011-09-20 DIAGNOSIS — N951 Menopausal and female climacteric states: Secondary | ICD-10-CM

## 2011-09-20 DIAGNOSIS — Z803 Family history of malignant neoplasm of breast: Secondary | ICD-10-CM

## 2011-09-20 NOTE — Progress Notes (Signed)
Kylie Munoz [birth year] [id number]   History:    36 y.o.  for annual gyn exam new patient to the practice. She does not recall when her last Pap smear was. She states she does have normal Pap smears in the past. And 2004 she had an abdominal hysterectomy. She was informed that she was missing her right ovary. Patient does not recall ever having had her right tube and ovary removed. She did have a tubal ligation prior to that. Patient does complain of some vasomotor symptoms. Patient frequently does her self breast examination. Her last mammogram was in 2010. Patient has a very strong family history of breast cancer whereby her mother had breast cancer and grandmother's on both sides of the family. Patient is a chronic smoker.  Past medical history,surgical history, family history and social history were all reviewed and documented in the EPIC chart.  Gynecologic History Patient's last menstrual period was 03/13/2002. Contraception: none Last Pap: ?Marland Kitchen Results were: No prior abnormal Pap smears Last mammogram: 2010. Results were: normal  Obstetric History OB History    Grav Para Term Preterm Abortions TAB SAB Ect Mult Living   2         2     # Outc Date GA Lbr Len/2nd Wgt Sex Del Anes PTL Lv   1 GRA            2 GRA                ROS: A ROS was performed and pertinent positives and negatives are included in the history.  GENERAL: No fevers or chills. HEENT: No change in vision, no earache, sore throat or sinus congestion. NECK: No pain or stiffness. CARDIOVASCULAR: No chest pain or pressure. No palpitations. PULMONARY: No shortness of breath, cough or wheeze. GASTROINTESTINAL: No abdominal pain, nausea, vomiting or diarrhea, melena or bright red blood per rectum. GENITOURINARY: No urinary frequency, urgency, hesitancy or dysuria. MUSCULOSKELETAL: No joint or muscle pain, no back pain, no recent trauma. DERMATOLOGIC: No rash, no itching, no lesions. ENDOCRINE: No polyuria, polydipsia, no heat  or cold intolerance. No recent change in weight. HEMATOLOGICAL: No anemia or easy bruising or bleeding. NEUROLOGIC: No headache, seizures, numbness, tingling or weakness. PSYCHIATRIC: No depression, no loss of interest in normal activity or change in sleep pattern.     Exam: chaperone present  BP 120/70  Ht 5' 6.25" (1.683 m)  Wt 208 lb (94.348 kg)  BMI 33.32 kg/m2  LMP 03/13/2002  Body mass index is 33.32 kg/(m^2).  General appearance : Well developed well nourished female. No acute distress HEENT: Neck supple, trachea midline, no carotid bruits, no thyroidmegaly Lungs: Clear to auscultation, no rhonchi or wheezes, or rib retractions  Heart: Regular rate and rhythm, no murmurs or gallops  Breast:Examined in sitting and supine position were symmetrical in appearance, no palpable masses or tenderness on the left breast but on the right breast between the 10 and 12:00 position to finger breast from the areolar region there was a 2 cm indurated ridge. No skin retraction, no nipple inversion, no nipple discharge, no skin discoloration, no axillary or supraclavicular lymphadenopathy  Abdomen: no palpable masses or tenderness, no rebound or guarding Extremities: no edema or skin discoloration or tenderness  Pelvic:  Bartholin, Urethra, Skene Glands: Within normal limits             Vagina: No gross lesions or discharge  Cervix: Absent Uterus  absent  Adnexa  Without masses or tenderness  Anus and perineum  normal   Rectovaginal  normal sphincter tone without palpated masses or tenderness             Hemoccult not done     Assessment/Plan:  36 y.o. female for annual exam with strong family history of breast cancer (mother and grandmother from both sides the family). Patient will be referred for genetic counseling and BRCA one BRCA2 testing. She stated that her sister was tested and did not carry the gene mutation. She'll be referred also for diagnostic mammogram and ultrasound of the  right breast. She was encouraged to stop smoking. Her primary physician previously prescribed her Chantix. Because of her vasomotor symptoms an FSH will be drawn today. Her primary physician done all her other labs recently were normal. Pap smear was done today. New screening guidelines were discussed. We discussed importance of calcium and vitamin D for osteoporosis prevention and regular exercise. Literature information on anti-smoking was provided.    Ok Edwards MD, 5:07 PM 09/20/2011

## 2011-09-20 NOTE — Patient Instructions (Addendum)
Health Maintenance, Females A healthy lifestyle and preventative care can promote health and wellness.  Maintain regular health, dental, and eye exams.   Eat a healthy diet. Foods like vegetables, fruits, whole grains, low-fat dairy products, and lean protein foods contain the nutrients you need without too many calories. Decrease your intake of foods high in solid fats, added sugars, and salt. Get information about a proper diet from your caregiver, if necessary.   Regular physical exercise is one of the most important things you can do for your health. Most adults should get at least 150 minutes of moderate-intensity exercise (any activity that increases your heart rate and causes you to sweat) each week. In addition, most adults need muscle-strengthening exercises on 2 or more days a week.    Maintain a healthy weight. The body mass index (BMI) is a screening tool to identify possible weight problems. It provides an estimate of body fat based on height and weight. Your caregiver can help determine your BMI, and can help you achieve or maintain a healthy weight. For adults 20 years and older:   A BMI below 18.5 is considered underweight.   A BMI of 18.5 to 24.9 is normal.   A BMI of 25 to 29.9 is considered overweight.   A BMI of 30 and above is considered obese.   Maintain normal blood lipids and cholesterol by exercising and minimizing your intake of saturated fat. Eat a balanced diet with plenty of fruits and vegetables. Blood tests for lipids and cholesterol should begin at age 20 and be repeated every 5 years. If your lipid or cholesterol levels are high, you are over 50, or you are a high risk for heart disease, you may need your cholesterol levels checked more frequently.Ongoing high lipid and cholesterol levels should be treated with medicines if diet and exercise are not effective.   If you smoke, find out from your caregiver how to quit. If you do not use tobacco, do not start.    If you are pregnant, do not drink alcohol. If you are breastfeeding, be very cautious about drinking alcohol. If you are not pregnant and choose to drink alcohol, do not exceed 1 drink per day. One drink is considered to be 12 ounces (355 mL) of beer, 5 ounces (148 mL) of wine, or 1.5 ounces (44 mL) of liquor.   Avoid use of street drugs. Do not share needles with anyone. Ask for help if you need support or instructions about stopping the use of drugs.   High blood pressure causes heart disease and increases the risk of stroke. Blood pressure should be checked at least every 1 to 2 years. Ongoing high blood pressure should be treated with medicines, if weight loss and exercise are not effective.   If you are 55 to 36 years old, ask your caregiver if you should take aspirin to prevent strokes.   Diabetes screening involves taking a blood sample to check your fasting blood sugar level. This should be done once every 3 years, after age 45, if you are within normal weight and without risk factors for diabetes. Testing should be considered at a younger age or be carried out more frequently if you are overweight and have at least 1 risk factor for diabetes.   Breast cancer screening is essential preventative care for women. You should practice "breast self-awareness." This means understanding the normal appearance and feel of your breasts and may include breast self-examination. Any changes detected, no matter how   small, should be reported to a caregiver. Women in their 20s and 30s should have a clinical breast exam (CBE) by a caregiver as part of a regular health exam every 1 to 3 years. After age 40, women should have a CBE every year. Starting at age 40, women should consider having a mammogram (breast X-ray) every year. Women who have a family history of breast cancer should talk to their caregiver about genetic screening. Women at a high risk of breast cancer should talk to their caregiver about having  an MRI and a mammogram every year.   The Pap test is a screening test for cervical cancer. Women should have a Pap test starting at age 21. Between ages 21 and 29, Pap tests should be repeated every 2 years. Beginning at age 30, you should have a Pap test every 3 years as long as the past 3 Pap tests have been normal. If you had a hysterectomy for a problem that was not cancer or a condition that could lead to cancer, then you no longer need Pap tests. If you are between ages 65 and 70, and you have had normal Pap tests going back 10 years, you no longer need Pap tests. If you have had past treatment for cervical cancer or a condition that could lead to cancer, you need Pap tests and screening for cancer for at least 20 years after your treatment. If Pap tests have been discontinued, risk factors (such as a new sexual partner) need to be reassessed to determine if screening should be resumed. Some women have medical problems that increase the chance of getting cervical cancer. In these cases, your caregiver may recommend more frequent screening and Pap tests.   The human papillomavirus (HPV) test is an additional test that may be used for cervical cancer screening. The HPV test looks for the virus that can cause the cell changes on the cervix. The cells collected during the Pap test can be tested for HPV. The HPV test could be used to screen women aged 30 years and older, and should be used in women of any age who have unclear Pap test results. After the age of 30, women should have HPV testing at the same frequency as a Pap test.   Colorectal cancer can be detected and often prevented. Most routine colorectal cancer screening begins at the age of 50 and continues through age 75. However, your caregiver may recommend screening at an earlier age if you have risk factors for colon cancer. On a yearly basis, your caregiver may provide home test kits to check for hidden blood in the stool. Use of a small camera at  the end of a tube, to directly examine the colon (sigmoidoscopy or colonoscopy), can detect the earliest forms of colorectal cancer. Talk to your caregiver about this at age 50, when routine screening begins. Direct examination of the colon should be repeated every 5 to 10 years through age 75, unless early forms of pre-cancerous polyps or small growths are found.   Hepatitis C blood testing is recommended for all people born from 1945 through 1965 and any individual with known risks for hepatitis C.   Practice safe sex. Use condoms and avoid high-risk sexual practices to reduce the spread of sexually transmitted infections (STIs). Sexually active women aged 25 and younger should be checked for Chlamydia, which is a common sexually transmitted infection. Older women with new or multiple partners should also be tested for Chlamydia. Testing for other   STIs is recommended if you are sexually active and at increased risk.   Osteoporosis is a disease in which the bones lose minerals and strength with aging. This can result in serious bone fractures. The risk of osteoporosis can be identified using a bone density scan. Women ages 30 and over and women at risk for fractures or osteoporosis should discuss screening with their caregivers. Ask your caregiver whether you should be taking a calcium supplement or vitamin D to reduce the rate of osteoporosis.   Menopause can be associated with physical symptoms and risks. Hormone replacement therapy is available to decrease symptoms and risks. You should talk to your caregiver about whether hormone replacement therapy is right for you.   Use sunscreen with a sun protection factor (SPF) of 30 or greater. Apply sunscreen liberally and repeatedly throughout the day. You should seek shade when your shadow is shorter than you. Protect yourself by wearing long sleeves, pants, a wide-brimmed hat, and sunglasses year round, whenever you are outdoors.   Notify your caregiver  of new moles or changes in moles, especially if there is a change in shape or color. Also notify your caregiver if a mole is larger than the size of a pencil eraser.   Stay current with your immunizations.  Document Released: 08/30/2010 Document Revised: 02/03/2011 Document Reviewed: 08/30/2010 [facility] Patient Information 2012 [place], [place].  Menopause Menopause is the normal time of life when menstrual periods stop completely. Menopause is complete when you have missed 12 consecutive menstrual periods. It usually occurs between the ages of 26 to 78, with an average age of 81. Very rarely does a woman develop menopause before 36 years old. At menopause, your ovaries stop producing the female hormones, estrogen and progesterone. This can cause undesirable symptoms and also affect your health. Sometimes the symptoms may occur 4 to 5 years before the menopause begins. There is no relationship between menopause and:  Oral contraceptives.   Number of children you had.   Race.   The age your menstrual periods started (menarche).  Heavy smokers and very thin women may develop menopause earlier in life. CAUSES  The ovaries stop producing the female hormones estrogen and progesterone.   Other causes include:   Surgery to remove both ovaries.   The ovaries stop functioning for no known reason.   Tumors of the pituitary gland in the brain.   Medical disease that affects the ovaries and hormone production.   Radiation treatment to the abdomen or pelvis.   Chemotherapy that affects the ovaries.  SYMPTOMS   Hot flashes.   Night sweats.   Decrease in sex drive.   Vaginal dryness and thinning of the vagina causing painful intercourse.   Dryness of the skin and developing wrinkles.   Headaches.   Tiredness.   Irritability.   Memory problems.   Weight gain.   Bladder infections.   Hair growth of the face and chest.   Infertility.  More serious symptoms include:  Loss  of bone (osteoporosis) causing breaks (fractures).   Depression.   Hardening and narrowing of the arteries (atherosclerosis) causing heart attacks and strokes.  DIAGNOSIS   When the menstrual periods have stopped for 12 straight months.   Physical exam.   Hormone studies of the blood.  TREATMENT  There are many treatment choices and nearly as many questions about them. The decisions to treat or not to treat menopausal changes is an individual choice made with your caregiver. Your caregiver can discuss the treatments  with you. Together, you can decide which treatment will work best for you. Your treatment choices may include:   Hormone therapy (estorgen and progesterone).   Non-hormonal medications.   Treating the individual symptoms with medication (for example antidepressants for depression).   Herbal medications that may help specific symptoms.   Counseling by a psychiatrist or psychologist.   Group therapy.   Lifestyle changes including:   Eating healthy.   Regular exercise.   Limiting caffeine and alcohol.   Stress management and meditation.   No treatment.  HOME CARE INSTRUCTIONS   Take the medication your caregiver gives you as directed.   Get plenty of sleep and rest.   Exercise regularly.   Eat a diet that contains calcium (good for the bones) and soy products (acts like estrogen hormone).   Avoid alcoholic beverages.   Do not smoke.   If you have hot flashes, dress in layers.   Take supplements, calcium and vitamin D to strengthen bones.   You can use over-the-counter lubricants or moisturizers for vaginal dryness.   Group therapy is sometimes very helpful.   Acupuncture may be helpful in some cases.  SEEK MEDICAL CARE IF:   You are not sure you are in menopause.   You are having menopausal symptoms and need advice and treatment.   You are still having menstrual periods after age 60.   You have pain with intercourse.   Menopause is  complete (no menstrual period for 12 months) and you develop vaginal bleeding.   You need a referral to a specialist (gynecologist, psychiatrist or psychologist) for treatment.  SEEK IMMEDIATE MEDICAL CARE IF:   You have severe depression.   You have excessive vaginal bleeding.   You fell and think you have a broken bone.   You have pain when you urinate.   You develop leg or chest pain.   You have a fast pounding heart beat (palpitations).   You have severe headaches.   You develop vision problems.   You feel a lump in your breast.   You have abdominal pain or severe indigestion.  Document Released: 05/07/2003 Document Revised: 02/03/2011 Document Reviewed: 12/13/2007 [facility] Patient Information 2012 [place], [place].Smoking Cessation This document explains the best ways for you to quit smoking and new treatments to help. It lists new medicines that can double or triple your chances of quitting and quitting for good. It also considers ways to avoid relapses and concerns you may have about quitting, including weight gain. NICOTINE: A POWERFUL ADDICTION If you have tried to quit smoking, you know how hard it can be. It is hard because nicotine is a very addictive drug. For some people, it can be as addictive as heroin or cocaine. Usually, people make 2 or 3 tries, or more, before finally being able to quit. Each time you try to quit, you can learn about what helps and what hurts. Quitting takes hard work and a lot of effort, but you can quit smoking. QUITTING SMOKING IS ONE OF THE MOST IMPORTANT THINGS YOU WILL EVER DO.  You will live longer, feel better, and live better.   The impact on your body of quitting smoking is felt almost immediately:   Within 20 minutes, blood pressure decreases. Pulse returns to its normal level.   After 8 hours, carbon monoxide levels in the blood return to normal. Oxygen level increases.   After 24 hours, chance of heart attack starts to decrease.  Breath, hair, and body stop smelling like  smoke.   After 48 hours, damaged nerve endings begin to recover. Sense of taste and smell improve.   After 72 hours, the body is virtually free of nicotine. Bronchial tubes relax and breathing becomes easier.   After 2 to 12 weeks, lungs can hold more air. Exercise becomes easier and circulation improves.   Quitting will reduce your risk of having a heart attack, stroke, cancer, or lung disease:   After 1 year, the risk of coronary heart disease is cut in half.   After 5 years, the risk of stroke falls to the same as a nonsmoker.   After 10 years, the risk of lung cancer is cut in half and the risk of other cancers decreases significantly.   After 15 years, the risk of coronary heart disease drops, usually to the level of a nonsmoker.   If you are pregnant, quitting smoking will improve your chances of having a healthy baby.   The people you live with, especially your children, will be healthier.   You will have extra money to spend on things other than cigarettes.  FIVE KEYS TO QUITTING Studies have shown that these 5 steps will help you quit smoking and quit for good. You have the best chances of quitting if you use them together: 1. Get ready.  2. Get support and encouragement.  3. Learn new skills and behaviors.  4. Get medicine to reduce your nicotine addiction and use it correctly.  5. Be prepared for relapse or difficult situations. Be determined to continue trying to quit, even if you do not succeed at first.  1. GET READY  Set a quit date.   Change your environment.   Get rid of ALL cigarettes, ashtrays, matches, and lighters in your home, car, and place of work.   Do not let people smoke in your home.   Review your past attempts to quit. Think about what worked and what did not.   Once you quit, do not smoke. NOT EVEN A PUFF!  2. GET SUPPORT AND ENCOURAGEMENT Studies have shown that you have a better chance of being  successful if you have help. You can get support in many ways.  Tell your family, friends, and coworkers that you are going to quit and need their support. Ask them not to smoke around you.   Talk to your caregivers (doctor, dentist, nurse, pharmacist, psychologist, and/or smoking counselor).   Get individual, group, or telephone counseling and support. The more counseling you have, the better your chances are of quitting. Programs are available at [employer] and health centers. Call your local health department for information about programs in your area.   Spiritual beliefs and practices may help some smokers quit.   Quit meters are Photographer that keep track of quit statistics, such as amount of "quit-time," cigarettes not smoked, and money saved.   Many smokers find one or more of the many self-help books available useful in helping them quit and stay off tobacco.  3. LEARN NEW SKILLS AND BEHAVIORS  Try to distract yourself from urges to smoke. Talk to someone, go for a walk, or occupy your time with a task.   When you first try to quit, change your routine. Take a different route to work. Drink tea instead of coffee. Eat breakfast in a different place.   Do something to reduce your stress. Take a hot bath, exercise, or read a book.   Plan something enjoyable to do  every day. Reward yourself for not smoking.   Explore interactive web-based programs that specialize in helping you quit.  4. GET MEDICINE AND USE IT CORRECTLY Medicines can help you stop smoking and decrease the urge to smoke. Combining medicine with the above behavioral methods and support can quadruple your chances of successfully quitting smoking. The [place]. Food and Drug Administration (FDA) has approved 7 medicines to help you quit smoking. These medicines fall into 3 categories.  Nicotine replacement therapy (delivers nicotine to your body without the negative effects and risks  of smoking):   Nicotine gum: Available over-the-counter.   Nicotine lozenges: Available over-the-counter.   Nicotine inhaler: Available by prescription.   Nicotine nasal spray: Available by prescription.   Nicotine skin patches (transdermal): Available by prescription and over-the-counter.   Antidepressant medicine (helps people abstain from smoking, but how this works is unknown):   Bupropion sustained-release (SR) tablets: Available by prescription.   Nicotinic receptor partial agonist (simulates the effect of nicotine in your brain):   Varenicline tartrate tablets: Available by prescription.   Ask your caregiver for advice about which medicines to use and how to use them. Carefully read the information on the package.   Everyone who is trying to quit may benefit from using a medicine. If you are pregnant or trying to become pregnant, nursing an infant, you are under age 17, or you smoke fewer than 10 cigarettes per day, talk to your caregiver before taking any nicotine replacement medicines.   You should stop using a nicotine replacement product and call your caregiver if you experience nausea, dizziness, weakness, vomiting, fast or irregular heartbeat, mouth problems with the lozenge or gum, or redness or swelling of the skin around the patch that does not go away.   Do not use any other product containing nicotine while using a nicotine replacement product.   Talk to your caregiver before using these products if you have diabetes, heart disease, asthma, stomach ulcers, you had a recent heart attack, you have high blood pressure that is not controlled with medicine, a history of irregular heartbeat, or you have been prescribed medicine to help you quit smoking.  5. BE PREPARED FOR RELAPSE OR DIFFICULT SITUATIONS  Most relapses occur within the first 3 months after quitting. Do not be discouraged if you start smoking again. Remember, most people try several times before they finally  quit.   You may have symptoms of withdrawal because your body is used to nicotine. You may crave cigarettes, be irritable, feel very hungry, cough often, get headaches, or have difficulty concentrating.   The withdrawal symptoms are only temporary. They are strongest when you first quit, but they will go away within 10 to 14 days.  Here are some difficult situations to watch for:  Alcohol. Avoid drinking alcohol. Drinking lowers your chances of successfully quitting.   Caffeine. Try to reduce the amount of caffeine you consume. It also lowers your chances of successfully quitting.   Other smokers. Being around smoking can make you want to smoke. Avoid smokers.   Weight gain. Many smokers will gain weight when they quit, usually less than 10 pounds. Eat a healthy diet and stay active. Do not let weight gain distract you from your main goal, quitting smoking. Some medicines that help you quit smoking may also help delay weight gain. You can always lose the weight gained after you quit.   Bad mood or depression. There are a lot of ways to improve your mood other than  smoking.  If you are having problems with any of these situations, talk to your caregiver. SPECIAL SITUATIONS AND CONDITIONS Studies suggest that everyone can quit smoking. Your situation or condition can give you a special reason to quit.  Pregnant women/new mothers: By quitting, you protect your baby's health and your own.   Hospitalized patients: By quitting, you reduce health problems and help healing.   Heart attack patients: By quitting, you reduce your risk of a second heart attack.   Lung, head, and neck cancer patients: By quitting, you reduce your chance of a second cancer.   Parents of children and adolescents: By quitting, you protect your children from illnesses caused by secondhand smoke.  QUESTIONS TO THINK ABOUT Think about the following questions before you try to stop smoking. You may want to talk about your  answers with your caregiver.  Why do you want to quit?   If you tried to quit in the past, what helped and what did not?   What will be the most difficult situations for you after you quit? How will you plan to handle them?   Who can help you through the tough times? Your family? Friends? Caregiver?   What pleasures do you get from smoking? What ways can you still get pleasure if you quit?  Here are some questions to ask your caregiver:  How can you help me to be successful at quitting?   What medicine do you think would be best for me and how should I take it?   What should I do if I need more help?   What is smoking withdrawal like? How can I get information on withdrawal?  Quitting takes hard work and a lot of effort, but you can quit smoking. FOR MORE INFORMATION  Smokefree.gov (http://www.davis-sullivan.com/) provides free, accurate, evidence-based information and professional assistance to help support the immediate and long-term needs of people trying to quit smoking. Document Released: 02/08/2001 Document Revised: 02/03/2011 Document Reviewed: 12/01/2008 [facility] Patient Information 2012 [place], [place].

## 2011-09-21 ENCOUNTER — Telehealth: Payer: Self-pay | Admitting: *Deleted

## 2011-09-21 ENCOUNTER — Ambulatory Visit: Payer: BC Managed Care – PPO | Admitting: Gynecology

## 2011-09-21 DIAGNOSIS — N63 Unspecified lump in unspecified breast: Secondary | ICD-10-CM

## 2011-09-21 NOTE — Telephone Encounter (Signed)
Confirmed 11/07/11 genetic appt w pt.  Pt is very anxious for this appt due to Dr. Lily Peer finding a lump and she is going to [facility] on 7/30 for a mammogram and requested a sooner appt.  Told pt that Clydie Braun is out until Monday and that I would speak with her when she gets back and contact her next week to let her know if we are able to see her sooner.

## 2011-09-21 NOTE — Telephone Encounter (Signed)
Message copied by Aura Camps on Wed Sep 21, 2011 10:45 AM ------      Message from: Ok Edwards      Created: Tue Sep 20, 2011  5:13 PM       Victorino Dike, please schedule diagnostic mammogram of this patient for her right breast with ultrasound as a result of the following:             right breast between the 10 and 12:00 position 2 finger breast from the areolar region there was a 2 cm indurated ridge.             Patient also needs to be referred to the [facility] for genetic counseling for BRCA1 and BRCA2 testing please let Amy know since she usually schedules this.

## 2011-09-21 NOTE — Telephone Encounter (Signed)
ORDER PLACED FOR BIL. DIAG. MAMMOGRAM DUE TO PT LAST MAMMO IN 2010 PER [facility]. APPOINTMENT 09/27/11 @ 8:50AM, LEFT MESSAGE FOR PT TO CALL. BRCA TESTING REFERRAL FORM FAXED TO  [facility], THEY WILL CONTACT PT WITH APPOINTMENT DATE.

## 2011-09-21 NOTE — Telephone Encounter (Signed)
PT INFORMED WITH THE BELOW NOTE. 

## 2011-09-23 ENCOUNTER — Ambulatory Visit
Admission: RE | Admit: 2011-09-23 | Discharge: 2011-09-23 | Disposition: A | Discharge status: Home / Self Care | Payer: BC Managed Care – PPO | Source: Ambulatory Visit | Attending: Gynecology | Admitting: Gynecology

## 2011-09-23 DIAGNOSIS — N63 Unspecified lump in unspecified breast: Secondary | ICD-10-CM

## 2011-09-23 NOTE — Telephone Encounter (Signed)
APPOINTMENT FOR THE BELOW ON 11/07/11 @ 11:00 AM

## 2011-09-26 ENCOUNTER — Other Ambulatory Visit: Payer: Self-pay | Admitting: Lab

## 2011-09-26 ENCOUNTER — Encounter: Payer: Self-pay | Admitting: Genetic Counselor

## 2011-09-26 ENCOUNTER — Ambulatory Visit (HOSPITAL_BASED_OUTPATIENT_CLINIC_OR_DEPARTMENT_OTHER): Payer: BC Managed Care – PPO | Admitting: Genetic Counselor

## 2011-09-26 ENCOUNTER — Ambulatory Visit: Payer: BC Managed Care – PPO

## 2011-09-26 DIAGNOSIS — IMO0002 Reserved for concepts with insufficient information to code with codable children: Secondary | ICD-10-CM

## 2011-09-26 DIAGNOSIS — Z803 Family history of malignant neoplasm of breast: Secondary | ICD-10-CM

## 2011-09-26 DIAGNOSIS — Z8041 Family history of malignant neoplasm of ovary: Secondary | ICD-10-CM

## 2011-09-26 NOTE — Progress Notes (Signed)
Dr.  Lily Peer requested a consultation for genetic counseling and risk assessment for Kylie Munoz, a 36 y.o. female, for discussion of her family history of breast and ovarian cancer. She presents to clinic today to discuss the possibility of a genetic predisposition to cancer, and to further clarify her risks, as well as her family members' risks for cancer.   HISTORY OF PRESENT ILLNESS: Kylie Munoz is a 36 y.o. female with no personal history of cancer.    No past medical history on file.  Past Surgical History  Procedure Date  . Abdominal hysterectomy 2004  . Cesarean section 2001  . Dental surgery 2006    History  Substance Use Topics  . Smoking status: Current Everyday Smoker -- 0.5 packs/day    Types: Cigarettes  . Smokeless tobacco: Never Used  . Alcohol Use: Yes     social    REPRODUCTIVE HISTORY AND PERSONAL RISK ASSESSMENT FACTORS: Menarche was at age 89.   Perimenopausal Uterus Intact: No Ovaries Intact: Yes, left ovary G2P2A0 , first live birth at age not asked  She has not previously undergone treatment for infertility.   Never OCP use    She has not used HRT in the past.    FAMILY HISTORY:  We obtained a detailed, 4-generation family history.  Significant diagnoses are listed below: Family History  Problem Relation Age of Onset  . Hypertension Mother   . Diabetes Mother   . Heart failure Mother   . Breast cancer Mother     2006 bilat mast; dx in late 54s and again in late 32s  . Hypertension Father   . Diabetes Father   . Heart failure Father   . Breast cancer Maternal Grandmother     bilateral cancer; diagnosed in mid 30s and again in her 46s  . Hypertension Maternal Grandmother   . Diabetes Maternal Grandmother   . Hypertension Maternal Grandfather   . Breast cancer Paternal Grandmother   . Stroke Paternal Grandmother   . Hypertension Paternal Grandmother   . Diabetes Paternal Grandmother   . Heart failure Paternal Grandmother   .  Hypertension Paternal Grandfather   . Ovarian cancer Paternal Aunt     paternal grandmother's sister  . Ovarian cancer Maternal Aunt     maternal grandmother's sister  The patient had a recent breast lump found that is not thought to be cancerous.  She has one sister and one brother.  Her sister tested negative for BRCA mutations last year.  Her mother was diagnosed with breast cancer in her late 66s. And then again in her late 56s to early 11s.  Her maternal grandmother was diagnosed with breast cancer in her mid 51s and then again in her 59s.  Her grandmother's sister was diagnosed with ovarian cancer at an unknown age, and her maternal great grandmother with breast cancer at an unknown age.  The patient's paternal grandmother was diagnosed with breast cancer in  Her mid to late 93s and then again in her late 34s-60s.  One of her sisters was also diagnosed with breast cancer and another sister was diagnosed with ovarian cancer, and her mother with breast cancer.  There is no other reported cancer history on either side of the family.  Patient's maternal ancestors are of [place] descent, and paternal ancestors are of [place] descent. There is no reported Ashkenazi Jewish ancestry. There is no known consanguinity.  GENETIC COUNSELING RISK ASSESSMENT, DISCUSSION, AND SUGGESTED FOLLOW UP: We reviewed  the natural history and genetic etiology of sporadic, familial and hereditary cancer syndromes.  About 5-10% of breast cancer is hereditary.  Of this, about 85% is the result of a BRCA1 or BRCA2 mutation.  We reviewed the red flags of hereditary cancer syndromes and the dominant inheritance patterns.  If the BRCA testing is negative, we discussed that we could be testing for the wrong gene.  We discussed gene panels, and that several cancer genes that are associated with different cancers can be tested at the same time.  Because of the different types of cancer that are in the patient's family, we will  consider one of the panel tests if she is negative for BRCA mutations.  The patient's family history is suggestive of the following possible diagnosis: hereditary cancer syndrome  We discussed that identification of a hereditary cancer syndrome may help her care providers tailor the patients medical management. If a mutation indicating a hereditary cancer syndrome is detected in this case, the [employer] recommendations would include increased cancer surveillance and possible prophylactic sugery. If a mutation is detected, the patient will be referred back to the referring provider and to any additional appropriate care providers to discuss the relevant options.   If a mutation is not found in the patient, this will decrease the likelihood of a hereditary cancer syndrome and cancer surveillance options would be discussed for the patient according to the appropriate standard National Comprehensive Cancer Network and American Cancer Society guidelines, with consideration of their personal and family history risk factors. In this case, the patient will be referred back to their care providers for discussions of management.   In order to estimate her chance of having a BRCA1 or BRCA2 mutation, we used statistical models (Penn II) and laboratory data that take into account her personal medical history, family history and ancestry.  Because each model is different, there can be a lot of variability in the risks they give.  Therefore, these numbers must be considered a rough range and not a precise risk of having a BRCA1 or BRCA2 mutation.  These models estimate that she has approximately a 17% chance of having a mutation. Based on this assessment of her family and personal history, genetic testing is recommended.  After considering the risks, benefits, and limitations, the patient provided informed consent for  the following  testing: BRACAnalysis through [facility] and BreastNext  Panel through [facility] if the BRCA testing is negative.   Per the patient's request, we will contact her by telephone to discuss these results. A follow up genetic counseling visit will be scheduled if indicated.  The patient was seen for a total of 60 minutes, greater than 50% of which was spent face-to-face counseling.  This plan is being carried out per Dr. Fontaine No recommendations.  This note will also be sent to the referring provider via the electronic medical record. The patient will be supplied with a summary of this genetic counseling discussion as well as educational information on the discussed hereditary cancer syndromes following the conclusion of their visit.   Patient was discussed with Dr. Drue Second.   _______________________________________________________________________ For Office Staff:  Number of people involved in session: 2 Was an Intern/ student involved with case: not applicable

## 2011-09-27 ENCOUNTER — Other Ambulatory Visit: Payer: BC Managed Care – PPO

## 2011-09-28 ENCOUNTER — Encounter: Payer: Self-pay | Admitting: *Deleted

## 2011-09-28 NOTE — Progress Notes (Signed)
Patient ID: Kylie Munoz, female   DOB: 06-22-1975, 36 y.o.   MRN: [id number] Per Dr Lily Peer request HPV test was added to her most recent pap smear

## 2011-09-28 NOTE — Telephone Encounter (Signed)
Message copied by Richardson Chiquito on Wed Sep 28, 2011  8:57 AM ------      Message from: Ok Edwards      Created: Tue Sep 27, 2011  4:58 PM       Sherrilyn Rist, please check with cytology and ask then to run and HPV 16 and 18 on this patient who has had a hysterectomy and who does not recall her past Pap smear history. Her last Pap smear was negative for dysplasia but positive for HPV.

## 2011-10-06 ENCOUNTER — Encounter: Payer: Self-pay | Admitting: Gynecology

## 2011-10-06 ENCOUNTER — Encounter: Payer: Self-pay | Admitting: *Deleted

## 2011-10-06 ENCOUNTER — Ambulatory Visit (INDEPENDENT_AMBULATORY_CARE_PROVIDER_SITE_OTHER): Payer: BC Managed Care – PPO | Admitting: Gynecology

## 2011-10-06 VITALS — BP 136/80

## 2011-10-06 DIAGNOSIS — R87629 Unspecified abnormal cytological findings in specimens from vagina: Secondary | ICD-10-CM

## 2011-10-06 NOTE — Progress Notes (Signed)
Patient ID: Kylie Munoz, female   DOB: May 12, 1975, 36 y.o.   MRN: [id number] Sent in order for HPV 16 and 18 add on testing to Cytology.

## 2011-10-06 NOTE — Progress Notes (Signed)
36 y.o who was seen  for annual gyn exam as a new patient to the practice on 09/20/2011. She did not recall when her last Pap smear was. She states she does have normal Pap smears in the past. In 2004 she had an abdominal hysterectomy for benign pathology. She was informed that she was missing her right ovary. Patient does not recall ever having had her right tube and ovary removed. Since we do not have documentation of that Pap smear we did do one in the following results were obtained:  NEGATIVE FOR INTRAEPITHELIAL LESIONS OR MALIGNANCY.  HPV High Risk **DETECTED**  HPV 16,18/45 Genotyping NEGATIVE for HPV 16 & 18/45   We discussed the above findings as well as the new ASCCP guidelines. We will proceed with Pap smear with co-testing in one year. All questions are answered and we'll follow accordingly.

## 2011-10-07 ENCOUNTER — Institutional Professional Consult (permissible substitution): Payer: BC Managed Care – PPO | Admitting: Gynecology

## 2011-10-25 ENCOUNTER — Telehealth: Payer: Self-pay | Admitting: Genetic Counselor

## 2011-10-25 ENCOUNTER — Encounter: Payer: Self-pay | Admitting: Genetic Counselor

## 2011-10-25 NOTE — Telephone Encounter (Signed)
Revealed negative BRCA testing and that we will have [facility] preauthorize the Breast Next panel testing.

## 2011-11-07 ENCOUNTER — Encounter: Payer: BC Managed Care – PPO | Admitting: Genetic Counselor

## 2011-11-07 ENCOUNTER — Other Ambulatory Visit: Payer: BC Managed Care – PPO | Admitting: Lab

## 2011-12-30 ENCOUNTER — Ambulatory Visit (INDEPENDENT_AMBULATORY_CARE_PROVIDER_SITE_OTHER): Payer: BC Managed Care – PPO | Admitting: Internal Medicine

## 2011-12-30 ENCOUNTER — Encounter: Payer: Self-pay | Admitting: Internal Medicine

## 2011-12-30 VITALS — BP 124/70 | HR 75 | Temp 96.5°F

## 2011-12-30 DIAGNOSIS — M5380 Other specified dorsopathies, site unspecified: Secondary | ICD-10-CM

## 2011-12-30 DIAGNOSIS — M549 Dorsalgia, unspecified: Secondary | ICD-10-CM

## 2011-12-30 DIAGNOSIS — M62830 Muscle spasm of back: Secondary | ICD-10-CM

## 2011-12-30 MED ORDER — CYCLOBENZAPRINE HCL 10 MG PO TABS: 10.0000 mg | ORAL_TABLET | Freq: Three times a day (TID) | ORAL | Status: DC | PRN

## 2011-12-30 MED ORDER — DICLOFENAC SODIUM 75 MG PO TBEC: 75.0000 mg | DELAYED_RELEASE_TABLET | Freq: Two times a day (BID) | ORAL | Status: DC

## 2011-12-30 NOTE — Patient Instructions (Signed)
Back Pain, Adult Low back pain is very common. About 1 in 5 people have back pain. The cause of low back pain is rarely dangerous. The pain often gets better over time. About half of people with a sudden onset of back pain feel better in just 2 weeks. About 8 in 10 people feel better by 6 weeks.   CAUSES Some common causes of back pain include:  Strain of the muscles or ligaments supporting the spine.   Wear and tear (degeneration) of the spinal discs.   Arthritis.   Direct injury to the back.  DIAGNOSIS Most of the time, the direct cause of low back pain is not known. However, back pain can be treated effectively even when the exact cause of the pain is unknown. Answering your caregiver's questions about your overall health and symptoms is one of the most accurate ways to make sure the cause of your pain is not dangerous. If your caregiver needs more information, he or she may order lab work or imaging tests (X-rays or MRIs). However, even if imaging tests show changes in your back, this usually does not require surgery. HOME CARE INSTRUCTIONS For many people, back pain returns. Since low back pain is rarely dangerous, it is often a condition that people can learn to manage on their own.    Remain active. It is stressful on the back to sit or stand in one place. Do not sit, drive, or stand in one place for more than 30 minutes at a time. Take short walks on level surfaces as soon as pain allows. Try to increase the length of time you walk each day.   Do not stay in bed. Resting more than 1 or 2 days can delay your recovery.   Do not avoid exercise or work. Your body is made to move. It is not dangerous to be active, even though your back may hurt. Your back will likely heal faster if you return to being active before your pain is gone.   Pay attention to your body when you  bend and lift. Many people have less discomfort when lifting if they bend their knees, keep the load close to their  bodies, and avoid twisting. Often, the most comfortable positions are those that put less stress on your recovering back.   Find a comfortable position to sleep. Use a firm mattress and lie on your side with your knees slightly bent. If you lie on your back, put a pillow under your knees.   Only take over-the-counter or prescription medicines as directed by your caregiver. Over-the-counter medicines to reduce pain and inflammation are often the most helpful. Your caregiver may prescribe muscle relaxant drugs. These medicines help dull your pain so you can more quickly return to your normal activities and healthy exercise.   Put ice on the injured area.   Put ice in a plastic bag.   Place a towel between your skin and the bag.   Leave the ice on for 15 to 20 minutes, 3 to 4 times a day for the first 2 to 3 days. After that, ice and heat may be alternated to reduce pain and spasms.   Ask your caregiver about trying back exercises and gentle massage. This may be of some benefit.   Avoid feeling anxious or stressed. Stress increases muscle tension and can worsen back pain. It is important to recognize when you are anxious or stressed and learn ways to manage it. Exercise is a great  option.  SEEK MEDICAL CARE IF:  You have pain that is not relieved with rest or medicine.   You have pain that does not improve in 1 week.   You have new symptoms.   You are generally not feeling well.  SEEK IMMEDIATE MEDICAL CARE IF:    You have pain that radiates from your back into your legs.   You develop new bowel or bladder control problems.   You have unusual weakness or numbness in your arms or legs.   You develop nausea or vomiting.   You develop abdominal pain.   You feel faint.  Document Released: 02/14/2005 Document Revised: 08/16/2011 Document Reviewed: 07/05/2010 [facility] Patient Information 2013 [place], [place].   Back Injury Prevention Back injuries can be extremely painful and  difficult to heal. After having one back injury, you are much more likely to experience another later on. It is important to learn how to avoid injuring or re-injuring your back. The following tips can help you to prevent a back injury. PHYSICAL FITNESS  Exercise regularly and try to develop good tone in your abdominal muscles. Your abdominal muscles provide a lot of the support needed by your back.   Do aerobic exercises (walking, jogging, biking, swimming) regularly.   Do exercises that increase balance and strength (tai chi, yoga) regularly. This can decrease your risk of falling and injuring your back.   Stretch before and after exercising.   Maintain a healthy weight. The more you weigh, the more stress is placed on your back. For every pound of weight, 10 times that amount of pressure is placed on the back.  DIET  Talk to your caregiver about how much calcium and vitamin D you need per day. These nutrients help to prevent weakening of the bones (osteoporosis). Osteoporosis can cause broken (fractured) bones that lead to back pain.   Include good sources of calcium in your diet, such as dairy products, green, leafy vegetables, and products with calcium added (fortified).   Include good sources of vitamin D in your diet, such as milk and foods that are fortified with vitamin D.   Consider taking a nutritional supplement or a multivitamin if needed.   Stop smoking if you smoke.  POSTURE  Sit and stand up straight. Avoid leaning forward when you sit or hunching over when you stand.   Choose chairs with good low back (lumbar) support.   If you work at a desk, sit close to your work so you do not need to lean over. Keep your chin tucked in. Keep your neck drawn back and elbows bent at a right angle. Your arms should look like the letter "L."   Sit high and close to the steering wheel when you drive. Add a lumbar support to your car seat if needed.   Avoid sitting or standing in one  position for too long. Take breaks to get up, stretch, and walk around at least once every hour. Take breaks if you are driving for long periods of time.   Sleep on your side with your knees slightly bent, or sleep on your back with a pillow under your knees. Do not sleep on your stomach.  LIFTING, TWISTING, AND REACHING  Avoid heavy lifting, especially repetitive lifting. If you must do heavy lifting:   Stretch before lifting.   Work slowly.   Rest between lifts.   Use carts and dollies to move objects when possible.   Make several small trips instead of carrying 1  heavy load.   Ask for help when you need it.   Ask for help when moving big, awkward objects.   Follow these steps when lifting:   Stand with your feet shoulder-width apart.   Get as close to the object as you can. Do not try to pick up heavy objects that are far from your body.   Use handles or lifting straps if they are available.   Bend at your knees. Squat down, but keep your heels off the floor.   Keep your shoulders pulled back, your chin tucked in, and your back straight.   Lift the object slowly, tightening the muscles in your legs, abdomen, and buttocks. Keep the object as close to the center of your body as possible.   When you put a load down, use these same guidelines in reverse.   Do not:   Lift the object above your waist.   Twist at the waist while lifting or carrying a load. Move your feet if you need to turn, not your waist.   Bend over without bending at your knees.   Avoid reaching over your head, across a table, or for an object on a high surface.  OTHER TIPS  Avoid wet floors and keep sidewalks clear of ice to prevent falls.   Do not sleep on a mattress that is too soft or too hard.   Keep items that are used frequently within easy reach.   Put heavier objects on shelves at waist level and lighter objects on lower or higher shelves.   Find ways to decrease your stress, such as  exercise, massage, or relaxation techniques. Stress can build up in your muscles. Tense muscles are more vulnerable to injury.   Seek treatment for depression or anxiety if needed. These conditions can increase your risk of developing back pain.  SEEK MEDICAL CARE IF:  You injure your back.   You have questions about diet, exercise, or other ways to prevent back injuries.  MAKE SURE YOU:  Understand these instructions.   Will watch your condition.   Will get help right away if you are not doing well or get worse.  Document Released: 03/24/2004 Document Revised: 05/09/2011 Document Reviewed: 03/28/2011 [facility] Patient Information 2013 [place], [place].   Back Exercises These exercises may help you when beginning to rehabilitate your injury. Your symptoms may resolve with or without further involvement from your physician, physical therapist or athletic trainer. While completing these exercises, remember:    Restoring tissue flexibility helps normal motion to return to the joints. This allows healthier, less painful movement and activity.   An effective stretch should be held for at least 30 seconds.   A stretch should never be painful. You should only feel a gentle lengthening or release in the stretched tissue.  STRETCH  Extension, Prone on Elbows   Lie on your stomach on the floor, a bed will be too soft. Place your palms about shoulder width apart and at the height of your head.   Place your elbows under your shoulders. If this is too painful, stack pillows under your chest.   Allow your body to relax so that your hips drop lower and make contact more completely with the floor.   Hold this position for __________ seconds.   Slowly return to lying flat on the floor.  Repeat __________ times. Complete this exercise __________ times per day.   RANGE OF MOTION  Extension, Prone Press Ups   Lie on your stomach  on the floor, a bed will be too soft. Place your palms about shoulder  width apart and at the height of your head.   Keeping your back as relaxed as possible, slowly straighten your elbows while keeping your hips on the floor. You may adjust the placement of your hands to maximize your comfort. As you gain motion, your hands will come more underneath your shoulders.   Hold this position __________ seconds.   Slowly return to lying flat on the floor.  Repeat __________ times. Complete this exercise __________ times per day.   RANGE OF MOTION- Quadruped, Neutral Spine   Assume a hands and knees position on a firm surface. Keep your hands under your shoulders and your knees under your hips. You may place padding under your knees for comfort.   Drop your head and point your tail bone toward the ground below you. This will round out your low back like an angry cat. Hold this position for __________ seconds.   Slowly lift your head and release your tail bone so that your back sags into a large arch, like an old horse.   Hold this position for __________ seconds.   Repeat this until you feel limber in your low back.   Now, find your "sweet spot." This will be the most comfortable position somewhere between the two previous positions. This is your neutral spine. Once you have found this position, tense your stomach muscles to support your low back.   Hold this position for __________ seconds.  Repeat __________ times. Complete this exercise __________ times per day.   STRETCH  Flexion, Single Knee to Chest   Lie on a firm bed or floor with both legs extended in front of you.   Keeping one leg in contact with the floor, bring your opposite knee to your chest. Hold your leg in place by either grabbing behind your thigh or at your knee.   Pull until you feel a gentle stretch in your low back. Hold __________ seconds.   Slowly release your grasp and repeat the exercise with the opposite side.  Repeat __________ times. Complete this exercise __________ times per  day.   STRETCH - Hamstrings, Standing  Stand or sit and extend your right / left leg, placing your foot on a chair or foot stool   Keeping a slight arch in your low back and your hips straight forward.   Lead with your chest and lean forward at the waist until you feel a gentle stretch in the back of your right / left knee or thigh. (When done correctly, this exercise requires leaning only a small distance.)   Hold this position for __________ seconds.  Repeat __________ times. Complete this stretch __________ times per day. STRENGTHENING  Deep Abdominals, Pelvic Tilt   Lie on a firm bed or floor. Keeping your legs in front of you, bend your knees so they are both pointed toward the ceiling and your feet are flat on the floor.   Tense your lower abdominal muscles to press your low back into the floor. This motion will rotate your pelvis so that your tail bone is scooping upwards rather than pointing at your feet or into the floor.   With a gentle tension and even breathing, hold this position for __________ seconds.  Repeat __________ times. Complete this exercise __________ times per day.   STRENGTHENING  Abdominals, Crunches   Lie on a firm bed or floor. Keeping your legs in front of you,  bend your knees so they are both pointed toward the ceiling and your feet are flat on the floor. Cross your arms over your chest.   Slightly tip your chin down without bending your neck.   Tense your abdominals and slowly lift your trunk high enough to just clear your shoulder blades. Lifting higher can put excessive stress on the low back and does not further strengthen your abdominal muscles.   Control your return to the starting position.  Repeat __________ times. Complete this exercise __________ times per day.   STRENGTHENING  Quadruped, Opposite UE/LE Lift   Assume a hands and knees position on a firm surface. Keep your hands under your shoulders and your knees under your hips. You may place  padding under your knees for comfort.   Find your neutral spine and gently tense your abdominal muscles so that you can maintain this position. Your shoulders and hips should form a rectangle that is parallel with the floor and is not twisted.   Keeping your trunk steady, lift your right hand no higher than your shoulder and then your left leg no higher than your hip. Make sure you are not holding your breath. Hold this position __________ seconds.   Continuing to keep your abdominal muscles tense and your back steady, slowly return to your starting position. Repeat with the opposite arm and leg.  Repeat __________ times. Complete this exercise __________ times per day. Document Released: 03/04/2005 Document Revised: 05/09/2011 Document Reviewed: 05/29/2008 [facility] Patient Information 2013 [place], [place].

## 2011-12-30 NOTE — Progress Notes (Signed)
Subjective:    Patient ID: Kylie Munoz, female    DOB: 03-12-1975, 36 y.o.   MRN: [id number]  HPI  Pt presents to clinic with c/o back pain. She says that the pain started in her lower back about 4 days ago. She thought she had slept wrong and that it would work itself out. Since then the pain has moved to between her shoulder blades accompanied by a tingling sensation. Pt has tried to rest her back but has been unable to. She has taken tylenol with mild relief. Pt does not report any lifting, bending or twisting injury to the back.   Review of Systems  No past medical history on file.  No current outpatient prescriptions on file.    Allergies  Allergen Reactions  . Codeine   . Hydrocodone     REACTION: itching  . Iohexol      Code: HIVES, Desc: PT had a near syncope event,developed hives & difficulty swallowing post injection of 125 ml Omni 300.Treated by Dr.McAlhany with 50 mg PO, 25mg  IV benedryl, 125 mg solu-cortef, 0.3 mg Epinephrine IM, 40 mg famotidine PO. Transferred to [facility] ED via EMS, Onset Date: [id number]   . Tramadol Hcl     REACTION: itching     History   Social History  . Marital Status: Married    Spouse Name: N/A    Number of Children: N/A  . Years of Education: N/A   Occupational History  . Not on file.   Social History Main Topics  . Smoking status: Current Every Day Smoker -- 0.5 packs/day    Types: Cigarettes  . Smokeless tobacco: Never Used  . Alcohol Use: Yes     social  . Drug Use: No  . Sexually Active: Yes -- Female partner(s)   Other Topics Concern  . Not on file   Social History Narrative  . No narrative on file   Pertinent positives noted in HPI  Constitutional: Denies fever, malaise, fatigue, headache or abrupt weight changes.  Respiratory: Denies difficulty breathing, shortness of breath, cough or sputum production.   Cardiovascular: Denies chest pain, chest tightness, palpitations or swelling in the hands or feet.    Musculoskeletal: Denies decrease in range of motion, difficulty with gait, muscle pain or joint pain and swelling.  Neurological: Denies dizziness, difficulty with memory, difficulty with speech or problems with balance and coordination, and numbness.  Psych: Pt reports being anxious and unsure why her back is hurting.  No other specific complaints in a complete review of systems (except as listed in HPI above).     Objective:   Physical Exam  LMP 03/13/2002 Wt Readings from Last 3 Encounters:  09/20/11 208 lb (94.348 kg)  08/31/11 204 lb (92.534 kg)  10/14/09 234 lb 2.1 oz (106.201 kg)    General: Appears her stated age, well developed, well nourished in NAD.Marland Kitchen  Cardiovascular: Normal rate and rhythm. S1,S2 noted.  No murmur, rubs or gallops noted. No JVD or BLE edema. No carotid bruits noted. Pulmonary/Chest: Normal effort and positive vesicular breath sounds. No respiratory distress. No wheezes, rales or ronchi noted.  Musculoskeletal: Normal range of motion. No signs of joint swelling. No difficulty with gait. No pinpoint tenderness along the vertebrae. Supraspinous muscles very tight with evidence of knots. Neurological: Alert and oriented. Cranial nerves II-XII intact. Sensation intact. Coordination normal. +DTRs bilaterally. Psychiatric: Pt appears anxious, affect normal. Behavior is normal. Judgment and thought content normal.       Assessment &  Plan:   Back Pain Back Spasms  Use a heating pad as needed to help alleviate the pain Stretch your back using the handout provided to avoid further irritation. eRx sent in for Diclofenac for pain and inflammation eRx sent in for Flexeril to relax muscles.  RTC as needed or is symptoms gets worse

## 2012-01-02 ENCOUNTER — Telehealth: Payer: Self-pay | Admitting: Internal Medicine

## 2012-01-02 NOTE — Telephone Encounter (Signed)
[facility] Triage Call Report Triage Record Num: [id number] Operator: Reino Bellis Patient Name: Kylie Munoz Call Date & Time: 12/30/2011 7:50:02PM Patient Phone: [phone] PCP: Nolon Rod. Paz Patient Gender: Female PCP Fax : Patient DOB: 1975/03/26 Practice Name: [facility] Reason for Call: Caller: Radley/Patient; PCP: Willow Ora; CB#: [phone]; Call regarding Medication issue; Pt. calling about medications not being at pharmacy. Pt. saw Nicki Reaper, NP @ [facility] office today for back pain and she was prescribed a muscle relaxer and an antiinflammaory medication. Pt. reports these medication were supposed to be called in to the pharmacy but there are no prescriptions available at the pharmacy. Per EPIC: cyclobenzaprine (FLEXERIL) 10 MG tablet/Dose: 10 mg/Route: Oral/ Frequency: 3 times daily PRN for muscle spasms/Dispense Quantity: 30 tablet/Refills: 0/Fills Remaining: 0/Sig: Take 1 tablet (10 mg total) by mouth 3 (three) times daily as needed for muscle spasms./Written Date: 12/30/11/Expiration Date: 12/29/12/Start Date: 12/30/11/Authorized by: Nicki Reaper, NP and diclofenac (VOLTAREN) 75 MG EC tablet/Dose: 75 mg/Route: Oral/Frequency: 2 times daily/Dispense Quantity: 30 tablet/Refills: 0/Fills Remaining: 0/Sig: Take 1 tablet (75 mg total) by mouth 2 (two) times daily./Written Date: 12/30/11/Expiration Date: 12/29/12/Start Date: 12/30/11/Authorized by: Nicki Reaper, NP. RX information given to [facility] [employer]. Pt. made aware and verbalizes understanding. Protocol(s) Used: Office Note Recommended Outcome per Protocol: Information Noted and Sent to Office Reason for Outcome: Caller information to office Care Advice: ~

## 2012-01-02 NOTE — Telephone Encounter (Signed)
Noted  

## 2013-04-02 ENCOUNTER — Ambulatory Visit: Payer: BC Managed Care – PPO | Admitting: Internal Medicine

## 2013-04-02 ENCOUNTER — Ambulatory Visit (INDEPENDENT_AMBULATORY_CARE_PROVIDER_SITE_OTHER): Payer: BC Managed Care – PPO | Admitting: Internal Medicine

## 2013-04-02 ENCOUNTER — Encounter: Payer: Self-pay | Admitting: Internal Medicine

## 2013-04-02 VITALS — BP 120/77 | HR 62 | Temp 98.5°F | Wt 216.0 lb

## 2013-04-02 DIAGNOSIS — H9209 Otalgia, unspecified ear: Secondary | ICD-10-CM

## 2013-04-02 DIAGNOSIS — J029 Acute pharyngitis, unspecified: Secondary | ICD-10-CM

## 2013-04-02 DIAGNOSIS — H9202 Otalgia, left ear: Secondary | ICD-10-CM

## 2013-04-02 LAB — POCT RAPID STREP A (OFFICE): RAPID STREP A SCREEN: NEGATIVE

## 2013-04-02 MED ORDER — AMOXICILLIN 500 MG PO CAPS: 1000.0000 mg | ORAL_CAPSULE | Freq: Two times a day (BID) | ORAL | Status: DC

## 2013-04-02 NOTE — Progress Notes (Signed)
   Subjective:    Patient ID: Kylie Munoz, female    DOB: [birth year], 38 y.o.   MRN: [id number]  HPI   Acute visit she had the flu 4 weeks ago, symptoms resolved except for sore throat described as a scratchy feeling in the throat. 3 days ago developed left ear pain, mild decreased hearing, pain is sharp and according to the patient intense. No ear discharge. On further questioning, otalgia is worse when she chew certain foods.  No past medical history on file. Past Surgical History  Procedure Laterality Date  . Abdominal hysterectomy  2004  . Cesarean section  2001  . Dental surgery  2006   History   Social History  . Marital Status: Married    Spouse Name: N/A    Number of Children: N/A  . Years of Education: N/A   Occupational History  . Not on file.   Social History Main Topics  . Smoking status: Current Every Day Smoker -- 0.50 packs/day    Types: Cigarettes  . Smokeless tobacco: Never Used  . Alcohol Use: Yes     Comment: social  . Drug Use: No  . Sexual Activity: Yes    Partners: Male   Other Topics Concern  . Not on file   Social History Narrative  . No narrative on file    Family History: DM - F, M CAD - F (dx in his 4730s) , M (dx in her 1950s) HTN - F, M hyperlipidemia - M, F hypothyroid - M, F COPD - F breast Ca - M colon ca--no     Fibromyalgia - M   Review of Systems Had fever  with the flu, in the last 2 days only subjective fevers. No chills. Mild cough without chest congestion or sputum production. Mild sinus congestion, currently with no nasal discharge.     Objective:   Physical Exam BP 120/77  Pulse 62  Temp(Src) 98.5 F (36.9 C)  Wt 216 lb (97.977 kg)  SpO2 97%  LMP 03/13/2002  General -- alert, well-developed, NAD.   HEENT-- Not pale. TMs slt bulge, no red , no d/c. Ear canals normal, neg trago sign; ? mild TTP at the L mastoid area. TMJ-- no click, slightly tender at the left TMJ Throat symmetric, no redness or  discharge.  Face symmetric, sinuses not tender to palpation. Nose not congested.  Lungs -- normal respiratory effort, no intercostal retractions, no accessory muscle use, and normal breath sounds.  Heart-- normal rate, regular rhythm, no murmur.  Extremities-- no pretibial edema bilaterally  Neurologic--  alert & oriented X3. Speech normal, gait normal, strength normal in all extremities.  Psych-- Cognition and judgment appear intact. Cooperative with normal attention span and concentration. No anxious or depressed appearing.     Assessment & Plan:  Otalgia, Left otalgia without much evidence of a OMA or sinusitis. She is a slightly tender at the mastoid area but  I doubt she has acute mastoiditis. She has mild sinus congestion Pain  related to TMJ ? Strep a test (-) Plan: Motrin, nasal steroids, if no better abx (mastoiditis?) See instructions

## 2013-04-02 NOTE — Patient Instructions (Signed)
Motrin 200 mg 2 tablets every 6 hours as needed for pain. Always take it with food because may cause gastritis and ulcers. If you notice nausea, stomach pain, change in the color of stools --->  Stop the medicine and let us know  For congestion use OTC Nasocort: 2 nasal sprays on each side of the nose daily until you feel better   Take the antibiotic as prescribed  (Amoxicillin) if no better in 3-4 days   Call anytime if the symptoms are severe

## 2013-04-04 ENCOUNTER — Other Ambulatory Visit: Payer: Self-pay | Admitting: *Deleted

## 2013-04-04 ENCOUNTER — Telehealth: Payer: Self-pay | Admitting: *Deleted

## 2013-04-04 MED ORDER — FLUCONAZOLE 150 MG PO TABS: 150.0000 mg | ORAL_TABLET | Freq: Once | ORAL | Status: DC

## 2013-04-04 NOTE — Telephone Encounter (Signed)
Patient called and stated that dr Drue Novelpaz was suppose to call in Diflucan in for her due to the amoxicillin he put her on. If possible patient would like 2 pills to be called in because she usually has to take two.   Ph[facility]acy [facility] [id number] - [place], [place] - [address] AT [facility] [address]

## 2013-04-04 NOTE — Telephone Encounter (Signed)
Diflucan 150 mg one tablet once a day, #2 no RF

## 2013-04-04 NOTE — Telephone Encounter (Signed)
Ok to send

## 2013-04-04 NOTE — Telephone Encounter (Signed)
Done. JG//CMA 

## 2013-05-06 ENCOUNTER — Encounter: Payer: Self-pay | Admitting: Gynecology

## 2013-05-27 ENCOUNTER — Encounter: Payer: Self-pay | Admitting: Gynecology

## 2013-05-27 ENCOUNTER — Ambulatory Visit (INDEPENDENT_AMBULATORY_CARE_PROVIDER_SITE_OTHER): Payer: BC Managed Care – PPO | Admitting: Gynecology

## 2013-05-27 ENCOUNTER — Other Ambulatory Visit (HOSPITAL_COMMUNITY)
Admission: RE | Admit: 2013-05-27 | Discharge: 2013-05-27 | Disposition: A | Discharge status: Home / Self Care | Payer: BC Managed Care – PPO | Source: Ambulatory Visit | Attending: Gynecology | Admitting: Gynecology

## 2013-05-27 VITALS — BP 120/74 | Ht 66.0 in | Wt 205.0 lb

## 2013-05-27 DIAGNOSIS — F17200 Nicotine dependence, unspecified, uncomplicated: Secondary | ICD-10-CM

## 2013-05-27 DIAGNOSIS — Z833 Family history of diabetes mellitus: Secondary | ICD-10-CM

## 2013-05-27 DIAGNOSIS — Z01419 Encounter for gynecological examination (general) (routine) without abnormal findings: Secondary | ICD-10-CM | POA: Insufficient documentation

## 2013-05-27 DIAGNOSIS — Z8742 Personal history of other diseases of the female genital tract: Secondary | ICD-10-CM

## 2013-05-27 DIAGNOSIS — Z803 Family history of malignant neoplasm of breast: Secondary | ICD-10-CM

## 2013-05-27 DIAGNOSIS — F17210 Nicotine dependence, cigarettes, uncomplicated: Secondary | ICD-10-CM

## 2013-05-27 DIAGNOSIS — Z1151 Encounter for screening for human papillomavirus (HPV): Secondary | ICD-10-CM | POA: Insufficient documentation

## 2013-05-27 DIAGNOSIS — N63 Unspecified lump in unspecified breast: Secondary | ICD-10-CM

## 2013-05-27 LAB — CBC WITH DIFFERENTIAL/PLATELET
Basophils Absolute: 0 10*3/uL (ref 0.0–0.1)
Basophils Relative: 0 % (ref 0–1)
EOS ABS: 0.2 10*3/uL (ref 0.0–0.7)
EOS PCT: 2 % (ref 0–5)
HCT: 44.7 % (ref 36.0–46.0)
HEMOGLOBIN: 15.8 g/dL — AB (ref 12.0–15.0)
LYMPHS ABS: 4 10*3/uL (ref 0.7–4.0)
Lymphocytes Relative: 38 % (ref 12–46)
MCH: 31.7 pg (ref 26.0–34.0)
MCHC: 35.3 g/dL (ref 30.0–36.0)
MCV: 89.6 fL (ref 78.0–100.0)
MONO ABS: 0.5 10*3/uL (ref 0.1–1.0)
MONOS PCT: 5 % (ref 3–12)
Neutro Abs: 5.7 10*3/uL (ref 1.7–7.7)
Neutrophils Relative %: 55 % (ref 43–77)
Platelets: 238 10*3/uL (ref 150–400)
RBC: 4.99 MIL/uL (ref 3.87–5.11)
RDW: 13.7 % (ref 11.5–15.5)
WBC: 10.4 10*3/uL (ref 4.0–10.5)

## 2013-05-27 MED ORDER — VARENICLINE TARTRATE 0.5 MG PO TABS: ORAL_TABLET | ORAL | Status: DC

## 2013-05-27 NOTE — Patient Instructions (Addendum)
Varenicline oral tablets What is this medicine? VARENICLINE (var EN i kleen) is used to help people quit smoking. It can reduce the symptoms caused by stopping smoking. It is used with a patient support program recommended by your physician. This medicine may be used for other purposes; ask your health care provider or pharmacist if you have questions. COMMON BRAND NAME(S): Chantix What should I tell my health care provider before I take this medicine? They need to know if you have any of these conditions: -bipolar disorder, depression, schizophrenia or other mental illness -heart disease -if you often drink alcohol -kidney disease -peripheral vascular disease -seizures -stroke -suicidal thoughts, plans, or attempt; a previous suicide attempt by you or a family member -an unusual or allergic reaction to varenicline, other medicines, foods, dyes, or preservatives -pregnant or trying to get pregnant -breast-feeding How should I use this medicine? You should set a date to stop smoking and tell your doctor. Start this medicine one week before the quit date. You can also start taking this medicine before you choose a quit date, and then pick a quit date that is between 8 and 35 days of treatment with this medicine. Stick to your plan; ask about support groups or other ways to help you remain a 'quitter'. Take this medicine by mouth after eating. Take with a full glass of water. Follow the directions on the prescription label. Take your doses at regular intervals. Do not take your medicine more often than directed. A special MedGuide will be given to you by the pharmacist with each prescription and refill. Be sure to read this information carefully each time. Talk to your pediatrician regarding the use of this medicine in children. This medicine is not approved for use in children. Overdosage: If you think you have taken too much of this medicine contact a poison control center or emergency room at  once. NOTE: This medicine is only for you. Do not share this medicine with others. What if I miss a dose? If you miss a dose, take it as soon as you can. If it is almost time for your next dose, take only that dose. Do not take double or extra doses. What may interact with this medicine? -alcohol or any product that contains alcohol -insulin -other stop smoking aids -theophylline -warfarin This list may not describe all possible interactions. Give your health care provider a list of all the medicines, herbs, non-prescription drugs, or dietary supplements you use. Also tell them if you smoke, drink alcohol, or use illegal drugs. Some items may interact with your medicine. What should I watch for while using this medicine? Visit your doctor or health care professional for regular check ups. Ask for ongoing advice and encouragement from your doctor or healthcare professional, friends, and family to help you quit. If you smoke while on this medication, quit again Your mouth may get dry. Chewing sugarless gum or sucking hard candy, and drinking plenty of water may help. Contact your doctor if the problem does not go away or is severe. You may get drowsy or dizzy. Do not drive, use machinery, or do anything that needs mental alertness until you know how this medicine affects you. Do not stand or sit up quickly, especially if you are an older patient. This reduces the risk of dizzy or fainting spells. The use of this medicine may increase the chance of suicidal thoughts or actions. Pay special attention to how you are responding while on this medicine. Any worsening of  mood, or thoughts of suicide or dying should be reported to your health care professional right away. What side effects may I notice from receiving this medicine? Side effects that you should report to your doctor or health care professional as soon as possible: -allergic reactions like skin rash, itching or hives, swelling of the face,  lips, tongue, or throat -breathing problems -changes in vision -chest pain or chest tightness -confusion, trouble speaking or understanding -fast, irregular heartbeat -feeling faint or lightheaded, falls -fever -pain in legs when walking -problems with balance, talking, walking -redness, blistering, peeling or loosening of the skin, including inside the mouth -ringing in ears -seizures -sudden numbness or weakness of the face, arm or leg -suicidal thoughts or other mood changes -trouble passing urine or change in the amount of urine -unusual bleeding or bruising -unusually weak or tired Side effects that usually do not require medical attention (report to your doctor or health care professional if they continue or are bothersome): -constipation -headache -nausea, vomiting -strange dreams -stomach gas -trouble sleeping This list may not describe all possible side effects. Call your doctor for medical advice about side effects. You may report side effects to FDA at 1-800-FDA-1088. Where should I keep my medicine? Keep out of the reach of children. Store at room temperature between 15 and 30 degrees C (59 and 86 degrees F). Throw away any unused medicine after the expiration date. NOTE: This sheet is a summary. It may not cover all possible information. If you have questions about this medicine, talk to your doctor, pharmacist, or health care provider.  2014, Elsevier/Gold Standard. (2012-11-26 13:37:47)   Smoking Cessation Quitting smoking is important to your health and has many advantages. However, it is not always easy to quit since nicotine is a very addictive drug. Often times, people try 3 times or more before being able to quit. This document explains the best ways for you to prepare to quit smoking. Quitting takes hard work and a lot of effort, but you can do it. ADVANTAGES OF QUITTING SMOKING  You will live longer, feel better, and live better.  Your body will feel the  impact of quitting smoking almost immediately.  Within 20 minutes, blood pressure decreases. Your pulse returns to its normal level.  After 8 hours, carbon monoxide levels in the blood return to normal. Your oxygen level increases.  After 24 hours, the chance of having a heart attack starts to decrease. Your breath, hair, and body stop smelling like smoke.  After 48 hours, damaged nerve endings begin to recover. Your sense of taste and smell improve.  After 72 hours, the body is virtually free of nicotine. Your bronchial tubes relax and breathing becomes easier.  After 2 to 12 weeks, lungs can hold more air. Exercise becomes easier and circulation improves.  The risk of having a heart attack, stroke, cancer, or lung disease is greatly reduced.  After 1 year, the risk of coronary heart disease is cut in half.  After 5 years, the risk of stroke falls to the same as a nonsmoker.  After 10 years, the risk of lung cancer is cut in half and the risk of other cancers decreases significantly.  After 15 years, the risk of coronary heart disease drops, usually to the level of a nonsmoker.  If you are pregnant, quitting smoking will improve your chances of having a healthy baby.  The people you live with, especially any children, will be healthier.  You will have extra money to  spend on things other than cigarettes. QUESTIONS TO THINK ABOUT BEFORE ATTEMPTING TO QUIT You may want to talk about your answers with your caregiver.  Why do you want to quit?  If you tried to quit in the past, what helped and what did not?  What will be the most difficult situations for you after you quit? How will you plan to handle them?  Who can help you through the tough times? Your family? Friends? A caregiver?  What pleasures do you get from smoking? What ways can you still get pleasure if you quit? Here are some questions to ask your caregiver:  How can you help me to be successful at quitting?  What  medicine do you think would be best for me and how should I take it?  What should I do if I need more help?  What is smoking withdrawal like? How can I get information on withdrawal? GET READY  Set a quit date.  Change your environment by getting rid of all cigarettes, ashtrays, matches, and lighters in your home, car, or work. Do not let people smoke in your home.  Review your past attempts to quit. Think about what worked and what did not. GET SUPPORT AND ENCOURAGEMENT You have a better chance of being successful if you have help. You can get support in many ways.  Tell your family, friends, and co-workers that you are going to quit and need their support. Ask them not to smoke around you.  Get individual, group, or telephone counseling and support. Programs are available at [employer] and health centers. Call your local health department for information about programs in your area.  Spiritual beliefs and practices may help some smokers quit.  Download a "quit meter" on your computer to keep track of quit statistics, such as how long you have gone without smoking, cigarettes not smoked, and money saved.  Get a self-help book about quitting smoking and staying off of tobacco. LEARN NEW SKILLS AND BEHAVIORS  Distract yourself from urges to smoke. Talk to someone, go for a walk, or occupy your time with a task.  Change your normal routine. Take a different route to work. Drink tea instead of coffee. Eat breakfast in a different place.  Reduce your stress. Take a hot bath, exercise, or read a book.  Plan something enjoyable to do every day. Reward yourself for not smoking.  Explore interactive web-based programs that specialize in helping you quit. GET MEDICINE AND USE IT CORRECTLY Medicines can help you stop smoking and decrease the urge to smoke. Combining medicine with the above behavioral methods and support can greatly increase your chances of successfully quitting  smoking.  Nicotine replacement therapy helps deliver nicotine to your body without the negative effects and risks of smoking. Nicotine replacement therapy includes nicotine gum, lozenges, inhalers, nasal sprays, and skin patches. Some may be available over-the-counter and others require a prescription.  Antidepressant medicine helps people abstain from smoking, but how this works is unknown. This medicine is available by prescription.  Nicotinic receptor partial agonist medicine simulates the effect of nicotine in your brain. This medicine is available by prescription. Ask your caregiver for advice about which medicines to use and how to use them based on your health history. Your caregiver will tell you what side effects to look out for if you choose to be on a medicine or therapy. Carefully read the information on the package. Do not use any other product containing nicotine while using  a nicotine replacement product.  RELAPSE OR DIFFICULT SITUATIONS Most relapses occur within the first 3 months after quitting. Do not be discouraged if you start smoking again. Remember, most people try several times before finally quitting. You may have symptoms of withdrawal because your body is used to nicotine. You may crave cigarettes, be irritable, feel very hungry, cough often, get headaches, or have difficulty concentrating. The withdrawal symptoms are only temporary. They are strongest when you first quit, but they will go away within 10 14 days. To reduce the chances of relapse, try to:  Avoid drinking alcohol. Drinking lowers your chances of successfully quitting.  Reduce the amount of caffeine you consume. Once you quit smoking, the amount of caffeine in your body increases and can give you symptoms, such as a rapid heartbeat, sweating, and anxiety.  Avoid smokers because they can make you want to smoke.  Do not let weight gain distract you. Many smokers will gain weight when they quit, usually less  than 10 pounds. Eat a healthy diet and stay active. You can always lose the weight gained after you quit.  Find ways to improve your mood other than smoking. FOR MORE INFORMATION  www.smokefree.gov  Document Released: 02/08/2001 Document Revised: 08/16/2011 Document Reviewed: 05/26/2011 [facility] Patient Information 2014 [place], [place].

## 2013-05-27 NOTE — Progress Notes (Signed)
Kylie Munoz 1975-09-15 [id number]   History:    38 y.o.  for annual gyn exam who has not been seen in the office since 2013 because of insurance coverage. Patient reports many years ago she had an abdominal hysterectomy with unilateral oophorectomy. Prior to that she did have tubal ligation. Patient has had sporadic vasomotor symptoms and had a normal [place] last year.Patient has a very strong family history of breast cancer whereby her mother had breast cancer and grandmother's on both sides of the family. She was referred to the geneticist and she was screened for the BRCA1 and BRCA2 gene mutation and was negative. In 2010 she had a colonoscopy whereby benign polyps were removed. She is a chronic smoker over 23 years whereby she smokes half a pack of cigarettes per day. She was counseled last year on the detrimental effects of smoking and she would like to stop.  Patient's Pap smear in 2013 was negative but positive HPV was detected and genotype for type 16,  18, and 48 were negative.   Past medical history,surgical history, family history and social history were all reviewed and documented in the EPIC chart.  Gynecologic History Patient's last menstrual period was 03/13/2002. Contraception: status post hysterectomy Last Pap: 2013. Results were: See above Last mammogram: 2013. Results were: normal  Obstetric History OB History  Gravida Para Term Preterm AB SAB TAB Ectopic Multiple Living  _0 # Outcome Date GA Lbr Len/2nd Weight Sex Delivery Anes PTL Lv  2 PAR           1 PAR                ROS: A ROS was performed and pertinent positives and negatives are included in the history.  GENERAL: No fevers or chills. HEENT: No change in vision, no earache, sore throat or sinus congestion. NECK: No pain or stiffness. CARDIOVASCULAR: No chest pain or pressure. No palpitations. PULMONARY: No shortness of breath, cough or wheeze. GASTROINTESTINAL: No abdominal pain, nausea,  vomiting or diarrhea, melena or bright red blood per rectum. GENITOURINARY: No urinary frequency, urgency, hesitancy or dysuria. MUSCULOSKELETAL: No joint or muscle pain, no back pain, no recent trauma. DERMATOLOGIC: No rash, no itching, no lesions. ENDOCRINE: No polyuria, polydipsia, no heat or cold intolerance. No recent change in weight. HEMATOLOGICAL: No anemia or easy bruising or bleeding. NEUROLOGIC: No headache, seizures, numbness, tingling or weakness. PSYCHIATRIC: No depression, no loss of interest in normal activity or change in sleep pattern.     Exam: chaperone present  BP 120/74  Ht _1  (1.676 m)  Wt 205 lb (92.987 kg)  BMI 33.10 kg/m2  LMP 03/13/2002  Body mass index is 33.1 kg/(m^2).  General appearance : Well developed well nourished female. No acute distress HEENT: Neck supple, trachea midline, no carotid bruits, no thyroidmegaly Lungs: Clear to auscultation, no rhonchi or wheezes, or rib retractions  Heart: Regular rate and rhythm, no murmurs or gallops Breast: Both breasts were examined in the sitting and supine position and were symmetrical in appearance with  no skin discoloration no nipple inversion no supraclavicular or  axillary lymphadenopathy but on the left breast at the 4:00 position 2 fingerbreadths from the areolar region there was a small nodular area that was nontender. There was also an area nontender ridge-like noted  on the right breast  2 fingerbreadths from the  areolar from the 10 to 12:00 position  was also noted and nontender..  Abdomen: no palpable masses or tenderness, no rebound or guarding Extremities: no edema or skin discoloration or tenderness  Pelvic:  Bartholin, Urethra, Skene Glands: Within normal limits             Vagina: No gross lesions or discharge  Cervix: Absent  Uterus absent  Adnexa  Without masses or tenderness  Anus and perineum  normal   Rectovaginal  normal sphincter tone without palpated masses or tenderness              Hemoccult not indicated     Assessment/Plan:  38 y.o. female for annual exam will be referred to the radiologist for diagnostic mammogram focusing on the 2 areas noted above. Pap smear was done today in accordance to new guidelines. Patient will be started on Chantix in an effort to help her stop smoking. The risks, benefits, pros, and cons were discussed literature and information was provided. The patient will schedule an ultrasound here in the office in next 2 weeks to determine if indeed at time of her previous hysterectomy if both ovaries were removed and if not which one is still less. She was reminded to contact her gastroenterologist since her colonoscopy 5 years ago and demonstrated benign polyps she needs followup. We discussed importance of calcium and vitamin D and regular exercise for osteoporosis prevention. The following labs were ordered: CBC, comprehensive metabolic panel, screening cholesterol, TSH, and urinalysis.  Note: This dictation was prepared with  Dragon/digital dictation along withSmart phrase technology. Any transcriptional errors that result from this process are unintentional.   Terrance Mass MD, 3:55 PM 05/27/2013

## 2013-05-28 ENCOUNTER — Other Ambulatory Visit: Payer: Self-pay | Admitting: Gynecology

## 2013-05-28 ENCOUNTER — Telehealth: Payer: Self-pay | Admitting: *Deleted

## 2013-05-28 DIAGNOSIS — N63 Unspecified lump in unspecified breast: Secondary | ICD-10-CM

## 2013-05-28 DIAGNOSIS — E7800 Pure hypercholesterolemia, unspecified: Secondary | ICD-10-CM

## 2013-05-28 LAB — CHOLESTEROL, TOTAL: Cholesterol: 201 mg/dL — ABNORMAL HIGH (ref 0–200)

## 2013-05-28 LAB — COMPREHENSIVE METABOLIC PANEL
ALT: 9 U/L (ref 0–35)
AST: 11 U/L (ref 0–37)
Albumin: 4.5 g/dL (ref 3.5–5.2)
Alkaline Phosphatase: 57 U/L (ref 39–117)
BUN: 10 mg/dL (ref 6–23)
CO2: 24 meq/L (ref 19–32)
CREATININE: 0.6 mg/dL (ref 0.50–1.10)
Calcium: 9.5 mg/dL (ref 8.4–10.5)
Chloride: 105 mEq/L (ref 96–112)
GLUCOSE: 102 mg/dL — AB (ref 70–99)
Potassium: 3.7 mEq/L (ref 3.5–5.3)
SODIUM: 136 meq/L (ref 135–145)
TOTAL PROTEIN: 7.1 g/dL (ref 6.0–8.3)
Total Bilirubin: 0.4 mg/dL (ref 0.2–1.2)

## 2013-05-28 LAB — URINALYSIS W MICROSCOPIC + REFLEX CULTURE
Bacteria, UA: NONE SEEN
Bilirubin Urine: NEGATIVE
CRYSTALS: NONE SEEN
Casts: NONE SEEN
Glucose, UA: NEGATIVE mg/dL
Hgb urine dipstick: NEGATIVE
KETONES UR: NEGATIVE mg/dL
Leukocytes, UA: NEGATIVE
Nitrite: NEGATIVE
Protein, ur: NEGATIVE mg/dL
SPECIFIC GRAVITY, URINE: 1.01 (ref 1.005–1.030)
Urobilinogen, UA: 0.2 mg/dL (ref 0.0–1.0)
pH: 5.5 (ref 5.0–8.0)

## 2013-05-28 LAB — TSH: TSH: 1.917 u[IU]/mL (ref 0.350–4.500)

## 2013-05-28 NOTE — Telephone Encounter (Signed)
Message copied by Thamas Jaegers on Tue May 28, 2013  9:58 AM ------      Message from: Terrance Mass      Created: Mon May 27, 2013  4:08 PM       Anderson Malta, please schedule diagnostic mammogram on this patient with concerning areas noted on both breasts as described below:            the left breast at the 4:00 position 2 fingerbreadths from the areolar region there was a small nodular area that was nontender. There was also an area nontender ridge-like noted  on the right breast  2 fingerbreadths from the  areolar from the 10 to 12:00 position was also noted and nontender..            Patient with family history of breast cancer in her mother and grandmother as well as an onset with ovarian cancer but patient tested negative for the BRCA1 and BRCA2 gene mutation ------

## 2013-05-28 NOTE — Telephone Encounter (Signed)
Orders placed to breast center, they will contact patient to schedule.

## 2013-06-07 NOTE — Telephone Encounter (Signed)
Appointment 06/17/13 @ 2:00 pm

## 2013-06-14 ENCOUNTER — Ambulatory Visit: Payer: BC Managed Care – PPO | Admitting: Gynecology

## 2013-06-14 ENCOUNTER — Other Ambulatory Visit: Payer: BC Managed Care – PPO

## 2013-06-17 ENCOUNTER — Inpatient Hospital Stay: Admission: RE | Admit: 2013-06-17 | Payer: BC Managed Care – PPO | Source: Ambulatory Visit

## 2013-06-17 ENCOUNTER — Other Ambulatory Visit: Payer: BC Managed Care – PPO

## 2013-06-28 ENCOUNTER — Other Ambulatory Visit: Payer: BC Managed Care – PPO

## 2013-06-28 ENCOUNTER — Ambulatory Visit: Payer: BC Managed Care – PPO | Admitting: Gynecology

## 2013-07-01 ENCOUNTER — Ambulatory Visit
Admission: RE | Admit: 2013-07-01 | Discharge: 2013-07-01 | Disposition: A | Discharge status: Home / Self Care | Payer: BC Managed Care – PPO | Source: Ambulatory Visit | Attending: Gynecology | Admitting: Gynecology

## 2013-07-01 ENCOUNTER — Encounter (INDEPENDENT_AMBULATORY_CARE_PROVIDER_SITE_OTHER): Payer: Self-pay

## 2013-07-01 DIAGNOSIS — N63 Unspecified lump in unspecified breast: Secondary | ICD-10-CM

## 2013-09-12 ENCOUNTER — Ambulatory Visit (INDEPENDENT_AMBULATORY_CARE_PROVIDER_SITE_OTHER): Payer: BC Managed Care – PPO | Admitting: Internal Medicine

## 2013-09-12 ENCOUNTER — Encounter: Payer: Self-pay | Admitting: Internal Medicine

## 2013-09-12 VITALS — BP 103/65 | HR 70 | Temp 97.9°F | Wt 205.0 lb

## 2013-09-12 DIAGNOSIS — F329 Major depressive disorder, single episode, unspecified: Secondary | ICD-10-CM

## 2013-09-12 DIAGNOSIS — F419 Anxiety disorder, unspecified: Principal | ICD-10-CM

## 2013-09-12 DIAGNOSIS — F341 Dysthymic disorder: Secondary | ICD-10-CM

## 2013-09-12 MED ORDER — ALPRAZOLAM 1 MG PO TABS: 0.5000 mg | ORAL_TABLET | Freq: Four times a day (QID) | ORAL | Status: DC | PRN

## 2013-09-12 NOTE — Assessment & Plan Note (Signed)
H/o anx-depression, currently very anxious. Counseled today Request xanax to be used prn. In the past tried SSRIs, welbutrin, etc but never really liked those meds nor they worked well per pt. Plan-- Xanax prn Watch for drowsiness (although states she does not get sleepy) rec to see a counselor RTC 2 months

## 2013-09-12 NOTE — Patient Instructions (Signed)
  Next visit is for routine check up in 2 months

## 2013-09-12 NOTE — Progress Notes (Signed)
   Subjective:    Patient ID: Kylie Munoz, female    DOB: 01-03-1976, 38 y.o.   MRN: [id number]  DOS:  09/12/2013 Type of visit - description: acute History: Sx started 4 weeks ago, mostly related to stress at work. ++ anxiety, "hard to calm myself" when upset. Also lost her mom and still misses her   ROS No depression per se. Not sleeping well No suicidal ideas. Marriage not very healthy  Past Medical History  Diagnosis Date  . Anxiety and depression   . Obesity   . Anxiety     Past Surgical History  Procedure Laterality Date  . Abdominal hysterectomy  2004    has one ovary  . Cesarean section  2001  . Dental surgery  2006  . Carpal tunnel release Right 2009    History   Social History  . Marital Status: Married    Spouse Name: N/A    Number of Children: 3  . Years of Education: N/A   Occupational History  . manager- restaurant     Social History Main Topics  . Smoking status: Current Every Day Smoker -- 0.50 packs/day    Types: Cigarettes  . Smokeless tobacco: Never Used  . Alcohol Use: Yes     Comment: social  . Drug Use: No  . Sexual Activity: Yes    Partners: Male    Birth Control/ Protection: Surgical     Comment: HYST   Other Topics Concern  . Not on file   Social History Narrative  . No narrative on file        Medication List       This list is accurate as of: 09/12/13  4:37 PM.  Always use your most recent med list.               ALPRAZolam 1 MG tablet  Commonly known as:  XANAX  Take 0.5 tablets (0.5 mg total) by mouth 4 (four) times daily as needed for anxiety or sleep.           Objective:   Physical Exam BP 103/65  Pulse 70  Temp(Src) 97.9 F (36.6 C)  Wt 205 lb (92.987 kg)  SpO2 98%  LMP 03/13/2002 General -- alert, well-developed   Neurologic--  alert & oriented X3. Speech normal, gait appropriate for age, strength symmetric and appropriate for age.  Psych-- Cognition and judgment appear intact. Cooperative  with normal attention span and concentration.  Tearful, anxious, no depress appearing      Assessment & Plan:

## 2013-09-12 NOTE — Progress Notes (Signed)
Pre visit review using our clinic review tool, if applicable. No additional management support is needed unless otherwise documented below in the visit note. 

## 2013-09-13 ENCOUNTER — Telehealth: Payer: Self-pay | Admitting: Internal Medicine

## 2013-09-13 NOTE — Telephone Encounter (Signed)
Relevant patient education mailed to patient.  

## 2013-09-23 ENCOUNTER — Encounter: Payer: Self-pay | Admitting: Physician Assistant

## 2013-09-23 ENCOUNTER — Ambulatory Visit (INDEPENDENT_AMBULATORY_CARE_PROVIDER_SITE_OTHER): Payer: BC Managed Care – PPO | Admitting: Physician Assistant

## 2013-09-23 VITALS — BP 94/60 | HR 62 | Temp 98.4°F | Resp 16 | Ht 66.0 in | Wt 205.1 lb

## 2013-09-23 DIAGNOSIS — G5602 Carpal tunnel syndrome, left upper limb: Secondary | ICD-10-CM

## 2013-09-23 DIAGNOSIS — G5600 Carpal tunnel syndrome, unspecified upper limb: Secondary | ICD-10-CM

## 2013-09-23 MED ORDER — HYDROCODONE-ACETAMINOPHEN 10-325 MG PO TABS: 1.0000 | ORAL_TABLET | Freq: Three times a day (TID) | ORAL | Status: DC | PRN

## 2013-09-23 MED ORDER — METHYLPREDNISOLONE (PAK) 4 MG PO TABS: ORAL_TABLET | ORAL | Status: DC

## 2013-09-23 NOTE — Patient Instructions (Signed)
Please take medications as directed.  Continue wearing your brace. Continue ice and elevation.  I will place a referral to Orthopeidic surgery for evaluation.  Hopefully the steroid will alleviate symptoms.  Carpal Tunnel Syndrome The carpal tunnel is a narrow area located on the palm side of your wrist. The tunnel is formed by the wrist bones and ligaments. Nerves, blood vessels, and tendons pass through the carpal tunnel. Repeated wrist motion or certain diseases may cause swelling within the tunnel. This swelling pinches the main nerve in the wrist (median nerve) and causes the painful hand and arm condition called carpal tunnel syndrome. CAUSES   Repeated wrist motions.  Wrist injuries.  Certain diseases like arthritis, diabetes, alcoholism, hyperthyroidism, and kidney failure.  Obesity.  Pregnancy. SYMPTOMS   A "pins and needles" feeling in your fingers or hand, especially in your thumb, index and middle fingers.  Tingling or numbness in your fingers or hand.  An aching feeling in your entire arm, especially when your wrist and elbow are bent for long periods of time.  Wrist pain that goes up your arm to your shoulder.  Pain that goes down into your palm or fingers.  A weak feeling in your hands. DIAGNOSIS  Your health care provider will take your history and perform a physical exam. An electromyography test may be needed. This test measures electrical signals sent out by your nerves into the muscles. The electrical signals are usually slowed by carpal tunnel syndrome. You may also need X-rays. TREATMENT  Carpal tunnel syndrome may clear up by itself. Your health care provider may recommend a wrist splint or medicine such as a nonsteroidal anti-inflammatory medicine. Cortisone injections may help. Sometimes, surgery may be needed to free the pinched nerve.  HOME CARE INSTRUCTIONS   Take all medicine as directed by your health care provider. Only take over-the-counter or  prescription medicines for pain, discomfort, or fever as directed by your health care provider.  If you were given a splint to keep your wrist from bending, wear it as directed. It is important to wear the splint at night. Wear the splint for as long as you have pain or numbness in your hand, arm, or wrist. This may take 1 to 2 months.  Rest your wrist from any activity that may be causing your pain. If your symptoms are work-related, you may need to talk to your employer about changing to a job that does not require using your wrist.  Put ice on your wrist after long periods of wrist activity.  Put ice in a plastic bag.  Place a towel between your skin and the bag.  Leave the ice on for 15-20 minutes, 03-04 times a day.  Keep all follow-up visits as directed by your health care provider. This includes any orthopedic referrals, physical therapy, and rehabilitation. Any delay in getting necessary care could result in a delay or failure of your condition to heal. SEEK IMMEDIATE MEDICAL CARE IF:   You have new, unexplained symptoms.  Your symptoms get worse and are not helped or controlled with medicines. MAKE SURE YOU:   Understand these instructions.  Will watch your condition.  Will get help right away if you are not doing well or get worse. Document Released: 02/12/2000 Document Revised: 07/01/2013 Document Reviewed: 12/31/2010 [facility] Patient Information 2015 [employer], [place]. This information is not intended to replace advice given to you by your health care provider. Make sure you discuss any questions you have with your health care provider.

## 2013-09-23 NOTE — Progress Notes (Signed)
Pre visit review using our clinic review tool, if applicable. No additional management support is needed unless otherwise documented below in the visit note/SLS  

## 2013-09-23 NOTE — Progress Notes (Signed)
Patient presents to clinic today c/o worsening CTS symptoms of left wrist after doing some heavy lifting 1 week ago.  Endorses having to lift a 30-lb item at work.  Has history of significant bilateral CTS requiring surgical intervention around 6 years ago. Endorses pain, swelling or wrist associated with numbness, tingling and decreased grip strength.  Denies pain, tenderness or altered sensorium elsewhere.  Has resumed wearing wrist splints.  Pain has not responded to OTC ibuprofen.  Past Medical History  Diagnosis Date  . Anxiety and depression   . Obesity   . Anxiety     Current Outpatient Prescriptions on File Prior to Visit  Medication Sig Dispense Refill  . ALPRAZolam (XANAX) 1 MG tablet Take 0.5 tablets (0.5 mg total) by mouth 4 (four) times daily as needed for anxiety or sleep.  60 tablet  0   No current facility-administered medications on file prior to visit.    Allergies  Allergen Reactions  . Iohexol      Code: HIVES, Desc: PT had a near syncope event,developed hives & difficulty swallowing post injection of 125 ml Omni 300.Treated by Dr.McAlhany with 50 mg PO, 25mg  IV benedryl, 125 mg solu-cortef, 0.3 mg Epinephrine IM, 40 mg famotidine PO. Transferred to [facility] ED via EMS, Onset Date: [id number]   . Tramadol Hcl     REACTION: itching    Family History  Problem Relation Age of Onset  . Hypertension Mother   . Diabetes Mother   . Heart failure Mother   . Breast cancer Mother     2006 bilat mast; dx in late 83s and again in late 31s  . Lung cancer Mother   . Hypertension Father   . Diabetes Father   . Heart failure Father   . Breast cancer Maternal Grandmother     bilateral cancer; diagnosed in mid 30s and again in her 53s  . Hypertension Maternal Grandmother   . Diabetes Maternal Grandmother   . Hypertension Maternal Grandfather   . Breast cancer Paternal Grandmother 53  . Stroke Paternal Grandmother   . Hypertension Paternal Grandmother   . Diabetes Paternal  Grandmother   . Heart failure Paternal Grandmother   . Hypertension Paternal Grandfather   . Ovarian cancer Paternal Aunt     paternal grandmother's sister  . Ovarian cancer Maternal Aunt     maternal grandmother's sister    History   Social History  . Marital Status: Married    Spouse Name: N/A    Number of Children: 3  . Years of Education: N/A   Occupational History  . manager- restaurant     Social History Main Topics  . Smoking status: Current Every Day Smoker -- 0.50 packs/day    Types: Cigarettes  . Smokeless tobacco: Never Used  . Alcohol Use: Yes     Comment: social  . Drug Use: No  . Sexual Activity: Yes    Partners: Male    Birth Control/ Protection: Surgical     Comment: HYST   Other Topics Concern  . None   Social History Narrative  . None   Review of Systems - See HPI.  All other ROS are negative.  BP 94/60  Pulse 62  Temp(Src) 98.4 F (36.9 C) (Oral)  Resp 16  Ht 5\' 6"  (1.676 m)  Wt 205 lb 2 oz (93.044 kg)  BMI 33.12 kg/m2  SpO2 99%  LMP 03/13/2002  Physical Exam  Vitals reviewed. Constitutional: She is oriented to person, place, and time  and well-developed, well-nourished, and in no distress.  HENT:  Head: Normocephalic and atraumatic.  Cardiovascular: Normal rate, regular rhythm, normal heart sounds and intact distal pulses.   Pulmonary/Chest: Effort normal and breath sounds normal. No respiratory distress. She has no wheezes. She has no rales. She exhibits no tenderness.  Musculoskeletal:       Arms: Neurological: She is alert and oriented to person, place, and time.  Skin: Skin is warm and dry. No rash noted.  Psychiatric: Affect normal.   Assessment/Plan: Carpal tunnel syndrome of left wrist Will Rx Medrol dose pack.  Rx Norco.  Continue RICE therapy.  Continue wrist splint.  Will place referral to Orthopedic surgery due to severity of symptoms.

## 2013-09-24 ENCOUNTER — Telehealth: Payer: Self-pay | Admitting: Internal Medicine

## 2013-09-24 MED ORDER — FLUCONAZOLE 150 MG PO TABS: 150.0000 mg | ORAL_TABLET | Freq: Once | ORAL | Status: DC

## 2013-09-24 NOTE — Telephone Encounter (Signed)
Medication sent in to pharmacy.  This is my fault as I meant to send it in yesterday with other medications.  Please inform patient.

## 2013-09-24 NOTE — Telephone Encounter (Signed)
Caller name: Maribel Relation to pt: Call back number:[phone]  [id number] [place], [place] [facility] [address] [facility] [address]   Reason for call:   Pt states that she discuss a medication for yeast infection with Selena BattenCody since she usually gets them from the steroids (?)  Pt wants to know if she can get this called in .

## 2013-09-24 NOTE — Telephone Encounter (Signed)
Left a message for patient to return call.

## 2013-09-25 DIAGNOSIS — G5602 Carpal tunnel syndrome, left upper limb: Secondary | ICD-10-CM | POA: Insufficient documentation

## 2013-09-25 NOTE — Assessment & Plan Note (Signed)
Will Rx Medrol dose pack.  Rx Norco.  Continue RICE therapy.  Continue wrist splint.  Will place referral to Orthopedic surgery due to severity of symptoms.

## 2013-10-21 ENCOUNTER — Other Ambulatory Visit: Payer: Self-pay | Admitting: Internal Medicine

## 2013-10-21 NOTE — Telephone Encounter (Signed)
Pt is calling to get this refilled.

## 2013-10-21 NOTE — Telephone Encounter (Signed)
Pt is requesting refill for Alprazolam.  Last OV: 09/23/13 Last Fill: 09/12/13 # 60 with 0 RF Last UDS: none  Please Advise.

## 2013-10-22 NOTE — Telephone Encounter (Signed)
Pt requesting Alprazolam refill.  Last OV: 09/23/2013 Last Fill: 09/12/2013 # 60 with 0 RF Last UDS: None  Please Advise.

## 2013-10-22 NOTE — Telephone Encounter (Signed)
Caller name: Darcel  Relation to pt: self  Call back number: [phone] [facility] [phone]   Reason for call: requesting refill ALPRAZolam (XANAX) 1 MG tablet

## 2013-10-23 NOTE — Telephone Encounter (Signed)
Pt requesting Alprazolam refill.  Last OV: 09/23/2013 Last Fill: 09/12/2013 # 60 with 0 RF Last UDS: None  Please Advise.  

## 2013-10-23 NOTE — Telephone Encounter (Signed)
Kylie Munoz [phone] [name] called to check on her refill ALPRAZolam [name]) 1 MG tablet, she now only has 2 pills left

## 2013-10-23 NOTE — Telephone Encounter (Signed)
Pt requesting Alprazolam refill.  Last OV: 09/23/2013  Last Fill: 09/12/2013 # 60 with 0 RF  Last UDS: None  Please Advise.   Pt states she only has 2 pills left.

## 2013-10-28 NOTE — Telephone Encounter (Signed)
Medication prescribed, #60 no refills, advice patient needs office visit before next refill

## 2013-10-28 NOTE — Telephone Encounter (Signed)
Medication faxed to pharmacy 

## 2013-12-03 ENCOUNTER — Telehealth: Payer: Self-pay | Admitting: Internal Medicine

## 2013-12-03 MED ORDER — ALPRAZOLAM 1 MG PO TABS: ORAL_TABLET | ORAL | Status: DC

## 2013-12-03 NOTE — Telephone Encounter (Signed)
Caller name:Hisako  Relation to pt: self  Call back number: [phone] [facility] [phone]   Reason for call: pt requesting a refill ALPRAZolam (XANAX) 1 MG tablet

## 2013-12-03 NOTE — Telephone Encounter (Signed)
Medication faxed to [facility].  

## 2013-12-03 NOTE — Telephone Encounter (Signed)
Pt is requesting refill for Alprazolam.  Last OV: 09/12/2013 Last Fill: 10/28/2013 # 60 0 RF UDS: None  Please advise.

## 2013-12-03 NOTE — Telephone Encounter (Signed)
Prescription printed, please schedule a followup

## 2013-12-30 ENCOUNTER — Encounter: Payer: Self-pay | Admitting: Physician Assistant

## 2014-01-06 ENCOUNTER — Ambulatory Visit (INDEPENDENT_AMBULATORY_CARE_PROVIDER_SITE_OTHER): Payer: BC Managed Care – PPO | Admitting: Medical

## 2014-01-06 ENCOUNTER — Encounter: Payer: Self-pay | Admitting: Medical

## 2014-01-06 VITALS — BP 122/83 | HR 62 | Temp 98.2°F | Ht 66.0 in | Wt 201.2 lb

## 2014-01-06 DIAGNOSIS — J0100 Acute maxillary sinusitis, unspecified: Secondary | ICD-10-CM

## 2014-01-06 MED ORDER — BENZONATATE 100 MG PO CAPS: 100.0000 mg | ORAL_CAPSULE | Freq: Three times a day (TID) | ORAL | Status: DC | PRN

## 2014-01-06 MED ORDER — AMOXICILLIN-POT CLAVULANATE 875-125 MG PO TABS: 1.0000 | ORAL_TABLET | Freq: Two times a day (BID) | ORAL | Status: DC

## 2014-01-06 MED ORDER — FLUCONAZOLE 150 MG PO TABS: ORAL_TABLET | ORAL | Status: DC

## 2014-01-06 NOTE — Patient Instructions (Addendum)
You appear to have sinusitis and possible bronchitis as well.  I am prescribing augmentin antibiotic and benzonatate for cough.  Please stop smoking.  For nasal congestion you could use flonase.  Follow up in 7 days or as needed.

## 2014-01-06 NOTE — Progress Notes (Signed)
Pre visit review using our clinic review tool, if applicable. No additional management support is needed unless otherwise documented below in the visit note. 

## 2014-01-06 NOTE — Assessment & Plan Note (Signed)
Prescribing augmentin antibiotic and benzonatate for cough.

## 2014-01-06 NOTE — Progress Notes (Signed)
Subjective:    Patient ID: Kylie Munoz, female    DOB: 08-22-1975, 38 y.o.   MRN: [id number]  HPI   Pt in feeling sick since Tuesday. Started with sore throat and nasal congestion. Friday got sinus pressure and chest congestion. Pt has colored nasal drainage and productive cough. Saturday night fever.  Pt does smoke. 1 pack a day.  No allegies to antibiotics. LMP- complete hysterectomy.  Past Medical History  Diagnosis Date  . Anxiety and depression   . Obesity   . Anxiety     History   Social History  . Marital Status: Married    Spouse Name: N/A    Number of Children: 3  . Years of Education: N/A   Occupational History  . manager- restaurant     Social History Main Topics  . Smoking status: Current Every Day Smoker -- 0.50 packs/day    Types: Cigarettes  . Smokeless tobacco: Never Used  . Alcohol Use: Yes     Comment: social  . Drug Use: No  . Sexual Activity:    Partners: Male    Birth Control/ Protection: Surgical     Comment: HYST   Other Topics Concern  . Not on file   Social History Narrative    Past Surgical History  Procedure Laterality Date  . Abdominal hysterectomy  2004    has one ovary  . Cesarean section  2001  . Dental surgery  2006  . Carpal tunnel release Right 2009    Family History  Problem Relation Age of Onset  . Hypertension Mother   . Diabetes Mother   . Heart failure Mother   . Breast cancer Mother     2006 bilat mast; dx in late 1240s and again in late 3750s  . Lung cancer Mother   . Hypertension Father   . Diabetes Father   . Heart failure Father   . Breast cancer Maternal Grandmother     bilateral cancer; diagnosed in mid 30s and again in her 5950s  . Hypertension Maternal Grandmother   . Diabetes Maternal Grandmother   . Hypertension Maternal Grandfather   . Breast cancer Paternal Grandmother [id number]  . Stroke Paternal Grandmother   . Hypertension Paternal Grandmother   . Diabetes Paternal Grandmother   . Heart failure  Paternal Grandmother   . Hypertension Paternal Grandfather   . Ovarian cancer Paternal Aunt     paternal grandmother's sister  . Ovarian cancer Maternal Aunt     maternal grandmother's sister    Allergies  Allergen Reactions  . Iohexol      Code: HIVES, Desc: PT had a near syncope event,developed hives & difficulty swallowing post injection of 125 ml Omni 300.Treated by Dr.McAlhany with 50 mg PO, 25mg  IV benedryl, 125 mg solu-cortef, 0.3 mg Epinephrine IM, 40 mg famotidine PO. [facility], Onset Date: [id number]   . Tramadol Hcl     REACTION: itching    Current Outpatient Prescriptions on File Prior to Visit  Medication Sig Dispense Refill  . ALPRAZolam (XANAX) 1 MG tablet TAKE 1/2 TABLET BY MOUTH FOUR TIMES DAILY AS NEEDED FOR ANXIETY OR SLEEP 60 tablet 1  . GARCINIA CAMBOGIA-CHROMIUM PO Take 1,500 mg by mouth daily.    . Misc Natural Products (COLON CLEANSE) CAPS Take 2 each by mouth daily.    . Multiple Vitamin (MULTIVITAMIN) tablet Take 1 tablet by mouth daily. Nature Made Women's    . omega-3 fish oil (MAXEPA) 1000  MG CAPS capsule Take 1 capsule by mouth daily.    Marland Kitchen. ibuprofen (ADVIL,MOTRIN) 200 MG tablet Take 200 mg by mouth every 6 (six) hours as needed (Wrist Pain).     No current facility-administered medications on file prior to visit.    BP 122/83 mmHg  Pulse 62  Temp(Src) 98.2 F (36.8 C) (Oral)  Ht 5\' 6"  (1.676 m)  Wt 201 lb 3.2 oz (91.264 kg)  BMI 32.49 kg/m2  SpO2 97%  LMP 03/13/2002    Review of Systems  Constitutional: Negative for fever, chills and fatigue.  HENT: Positive for congestion, sinus pressure and sore throat. Negative for drooling, ear pain, facial swelling, nosebleeds and postnasal drip.   Respiratory: Positive for cough. Negative for chest tightness and wheezing.   Cardiovascular: Negative for chest pain and palpitations.  Gastrointestinal: Negative.   Genitourinary: Negative.   Musculoskeletal: Negative.   Neurological:  Negative.   Hematological: Negative for adenopathy. Does not bruise/bleed easily.       Objective:   Physical Exam   General  Mental Status - Alert. General Appearance - Well groomed. Not in acute distress.  Skin Rashes- No Rashes.  HEENT Head- Normal. Ear Auditory Canal - Left- Normal. Right - Normal.Tympanic Membrane- Left- Normal. Right- Normal. Eye Sclera/Conjunctiva- Left- Normal. Right- Normal. Nose & Sinuses Nasal Mucosa- Left- Not Boggy or Congested. Right- Not Boggy or Congested. Maxillary sinus pressure. Mouth & Throat Lips: Upper Lip- Normal: no dryness, cracking, pallor, cyanosis, or vesicular eruption. Lower Lip-Normal: no dryness, cracking, pallor, cyanosis or vesicular eruption. Buccal Mucosa- Bilateral- No Aphthous ulcers. Oropharynx- No Discharge or Erythema. Tonsils: Characteristics- Bilateral- No Erythema or Congestion. Size/Enlargement- Bilateral- No enlargement. Discharge- bilateral-None.  Neck Neck- Supple. No Masses. Mild enlarged submandibular nodes.   Chest and Lung Exam Auscultation: Breath Sounds:-Normal. CTA  Cardiovascular Auscultation:Rythm- Regular, rate and rhythm. Murmurs & Other Heart Sounds:Ausculatation of the heart reveal- No Murmurs.  Lymphatic Head & Neck General Head & Neck Lymphatics: Bilateral: Description- No Localized lymphadenopathy. SEE neck.         Assessment & Plan:

## 2014-01-07 ENCOUNTER — Telehealth: Payer: Self-pay | Admitting: Internal Medicine

## 2014-01-07 NOTE — Telephone Encounter (Signed)
emmi mailed  °

## 2014-01-08 ENCOUNTER — Telehealth: Payer: Self-pay | Admitting: Internal Medicine

## 2014-01-08 NOTE — Telephone Encounter (Signed)
Pt is requesting refill on Alprazolam.   Last OV: 09/12/2013 Last Fill: 12/03/2013 # 60 1 RF Pt takes 1/2 tablet four times daily PRN UDS: None  Please advise.

## 2014-01-08 NOTE — Telephone Encounter (Signed)
Spoke with Pt, she is unsure if medication was given with 1 refill. She stated she would check bottle when she got home and give call back.

## 2014-01-08 NOTE — Telephone Encounter (Signed)
Pt requesting a refill ALPRAZolam (XANAX) 1 MG tablet

## 2014-01-08 NOTE — Telephone Encounter (Signed)
Spoke with Pt, she didn't realize that 1 RF was given in October, she has called Pharmacy and requested refill on Alprazolam.

## 2014-01-08 NOTE — Telephone Encounter (Signed)
Denied, too early

## 2014-01-28 ENCOUNTER — Encounter: Payer: Self-pay | Admitting: Physician Assistant

## 2014-01-28 ENCOUNTER — Ambulatory Visit (INDEPENDENT_AMBULATORY_CARE_PROVIDER_SITE_OTHER): Payer: BC Managed Care – PPO | Admitting: Physician Assistant

## 2014-01-28 VITALS — BP 113/70 | HR 68 | Temp 97.8°F | Resp 18 | Ht 66.0 in | Wt 200.2 lb

## 2014-01-28 DIAGNOSIS — J0140 Acute pansinusitis, unspecified: Secondary | ICD-10-CM | POA: Insufficient documentation

## 2014-01-28 DIAGNOSIS — J029 Acute pharyngitis, unspecified: Secondary | ICD-10-CM

## 2014-01-28 DIAGNOSIS — J019 Acute sinusitis, unspecified: Secondary | ICD-10-CM

## 2014-01-28 LAB — POCT RAPID STREP A (OFFICE): RAPID STREP A SCREEN: NEGATIVE

## 2014-01-28 MED ORDER — LEVOFLOXACIN 750 MG PO TABS: 750.0000 mg | ORAL_TABLET | Freq: Every day | ORAL | Status: DC

## 2014-01-28 MED ORDER — FLUCONAZOLE 150 MG PO TABS: ORAL_TABLET | ORAL | Status: DC

## 2014-01-28 MED ORDER — PROMETHAZINE-DM 6.25-15 MG/5ML PO SYRP: 5.0000 mL | ORAL_SOLUTION | Freq: Four times a day (QID) | ORAL | Status: DC | PRN

## 2014-01-28 NOTE — Progress Notes (Signed)
Pre visit review using our clinic review tool, if applicable. No additional management support is needed unless otherwise documented below in the visit note/SLS  

## 2014-01-28 NOTE — Patient Instructions (Signed)
Please take antibiotic as directed.  Increase fluid intake.  Use Saline nasal spray.  Take a daily multivitamin. Use Promethazine-DM for cough.  Place a humidifier in the bedroom.  Please call or return clinic if symptoms are not improving.  Sinusitis Sinusitis is redness, soreness, and swelling (inflammation) of the paranasal sinuses. Paranasal sinuses are air pockets within the bones of your face (beneath the eyes, the middle of the forehead, or above the eyes). In healthy paranasal sinuses, mucus is able to drain out, and air is able to circulate through them by way of your nose. However, when your paranasal sinuses are inflamed, mucus and air can become trapped. This can allow bacteria and other germs to grow and cause infection. Sinusitis can develop quickly and last only a short time (acute) or continue over a long period (chronic). Sinusitis that lasts for more than 12 weeks is considered chronic.  CAUSES  Causes of sinusitis include:  Allergies.  Structural abnormalities, such as displacement of the cartilage that separates your nostrils (deviated septum), which can decrease the air flow through your nose and sinuses and affect sinus drainage.  Functional abnormalities, such as when the small hairs (cilia) that line your sinuses and help remove mucus do not work properly or are not present. SYMPTOMS  Symptoms of acute and chronic sinusitis are the same. The primary symptoms are pain and pressure around the affected sinuses. Other symptoms include:  Upper toothache.  Earache.  Headache.  Bad breath.  Decreased sense of smell and taste.  A cough, which worsens when you are lying flat.  Fatigue.  Fever.  Thick drainage from your nose, which often is green and may contain pus (purulent).  Swelling and warmth over the affected sinuses. DIAGNOSIS  Your caregiver will perform a physical exam. During the exam, your caregiver may:  Look in your nose for signs of abnormal growths  in your nostrils (nasal polyps).  Tap over the affected sinus to check for signs of infection.  View the inside of your sinuses (endoscopy) with a special imaging device with a light attached (endoscope), which is inserted into your sinuses. If your caregiver suspects that you have chronic sinusitis, one or more of the following tests may be recommended:  Allergy tests.  Nasal culture A sample of mucus is taken from your nose and sent to a lab and screened for bacteria.  Nasal cytology A sample of mucus is taken from your nose and examined by your caregiver to determine if your sinusitis is related to an allergy. TREATMENT  Most cases of acute sinusitis are related to a viral infection and will resolve on their own within 10 days. Sometimes medicines are prescribed to help relieve symptoms (pain medicine, decongestants, nasal steroid sprays, or saline sprays).  However, for sinusitis related to a bacterial infection, your caregiver will prescribe antibiotic medicines. These are medicines that will help kill the bacteria causing the infection.  Rarely, sinusitis is caused by a fungal infection. In theses cases, your caregiver will prescribe antifungal medicine. For some cases of chronic sinusitis, surgery is needed. Generally, these are cases in which sinusitis recurs more than 3 times per year, despite other treatments. HOME CARE INSTRUCTIONS   Drink plenty of water. Water helps thin the mucus so your sinuses can drain more easily.  Use a humidifier.  Inhale steam 3 to 4 times a day (for example, sit in the bathroom with the shower running).  Apply a warm, moist washcloth to your face 3 to  4 times a day, or as directed by your caregiver.  Use saline nasal sprays to help moisten and clean your sinuses.  Take over-the-counter or prescription medicines for pain, discomfort, or fever only as directed by your caregiver. SEEK IMMEDIATE MEDICAL CARE IF:  You have increasing pain or severe  headaches.  You have nausea, vomiting, or drowsiness.  You have swelling around your face.  You have vision problems.  You have a stiff neck.  You have difficulty breathing. MAKE SURE YOU:   Understand these instructions.  Will watch your condition.  Will get help right away if you are not doing well or get worse. Document Released: 02/14/2005 Document Revised: 05/09/2011 Document Reviewed: 03/01/2011 [facility] Patient Information 2014 [place], [place].

## 2014-01-28 NOTE — Assessment & Plan Note (Signed)
Rapid strep negative.  Suspect secondary to significant PND from sinusitis.  Supportive measures discussed.

## 2014-01-28 NOTE — Progress Notes (Signed)
Patient presents to clinic today c/o recurrence of sinus pressure, sinus pain, ear aches, headache, PND and cough productive of green sputum.  Also endorses purulent rhinorrhea.  Patient endorses moderate fatigue but denies fever, chills, aches.  Endorses mild sore throat secondary to PND. Also endorses mild tooth pain L>R.  Denies recent travel or sick contact. Was treated 2.5 weeks ago for sinusitis with Augmentin.  States symptoms never resolved while on antibiotic.  Past Medical History  Diagnosis Date  . Anxiety and depression   . Obesity   . Anxiety     Current Outpatient Prescriptions on File Prior to Visit  Medication Sig Dispense Refill  . ALPRAZolam (XANAX) 1 MG tablet TAKE 1/2 TABLET BY MOUTH FOUR TIMES DAILY AS NEEDED FOR ANXIETY OR SLEEP 60 tablet 1  . GARCINIA CAMBOGIA-CHROMIUM PO Take 1,500 mg by mouth daily.    Marland Kitchen. ibuprofen (ADVIL,MOTRIN) 200 MG tablet Take 200 mg by mouth every 6 (six) hours as needed (Wrist Pain).    . Misc Natural Products (COLON CLEANSE) CAPS Take 2 each by mouth daily.    . Multiple Vitamin (MULTIVITAMIN) tablet Take 1 tablet by mouth daily. Nature Made Women's    . omega-3 fish oil (MAXEPA) 1000 MG CAPS capsule Take 1 capsule by mouth daily.     No current facility-administered medications on file prior to visit.    Allergies  Allergen Reactions  . Iohexol      Code: HIVES, Desc: PT had a near syncope event,developed hives & difficulty swallowing post injection of 125 ml Omni 300.Treated by Dr.McAlhany with 50 mg PO, 25mg  IV benedryl, 125 mg solu-cortef, 0.3 mg Epinephrine IM, 40 mg famotidine PO. [facility], Onset Date: [id number]   . Tramadol Hcl     REACTION: itching    Family History  Problem Relation Age of Onset  . Hypertension Mother   . Diabetes Mother   . Heart failure Mother   . Breast cancer Mother     2006 bilat mast; dx in late 5640s and again in late 6350s  . Lung cancer Mother   . Hypertension Father   .  Diabetes Father   . Heart failure Father   . Breast cancer Maternal Grandmother     bilateral cancer; diagnosed in mid 30s and again in her 450s  . Hypertension Maternal Grandmother   . Diabetes Maternal Grandmother   . Hypertension Maternal Grandfather   . Breast cancer Paternal Grandmother [id number]  . Stroke Paternal Grandmother   . Hypertension Paternal Grandmother   . Diabetes Paternal Grandmother   . Heart failure Paternal Grandmother   . Hypertension Paternal Grandfather   . Ovarian cancer Paternal Aunt     paternal grandmother's sister  . Ovarian cancer Maternal Aunt     maternal grandmother's sister    History   Social History  . Marital Status: Married    Spouse Name: N/A    Number of Children: 3  . Years of Education: N/A   Occupational History  . manager- restaurant     Social History Main Topics  . Smoking status: Current Every Day Smoker -- 0.50 packs/day    Types: Cigarettes  . Smokeless tobacco: Never Used  . Alcohol Use: Yes     Comment: social  . Drug Use: No  . Sexual Activity:    Partners: Male    Birth Control/ Protection: Surgical     Comment: HYST   Other Topics Concern  . None  Social History Narrative   Review of Systems - See HPI.  All other ROS are negative.  BP 113/70 mmHg  Pulse 68  Temp(Src) 97.8 F (36.6 C) (Oral)  Resp 18  Ht 5\' 6"  (1.676 m)  Wt 200 lb 4 oz (90.833 kg)  BMI 32.34 kg/m2  SpO2 100%  LMP 03/13/2002  Physical Exam  Constitutional: She is oriented to person, place, and time and well-developed, well-nourished, and in no distress.  HENT:  Head: Normocephalic and atraumatic.  Right Ear: Tympanic membrane, external ear and ear canal normal.  Left Ear: Tympanic membrane, external ear and ear canal normal.  Nose: Mucosal edema and rhinorrhea present. Right sinus exhibits frontal sinus tenderness. Right sinus exhibits no maxillary sinus tenderness. Left sinus exhibits maxillary sinus tenderness and frontal sinus  tenderness.  Mouth/Throat: Uvula is midline, oropharynx is clear and moist and mucous membranes are normal.  Eyes: Conjunctivae are normal. Pupils are equal, round, and reactive to light.  Neck: Neck supple.  Cardiovascular: Normal rate, regular rhythm, normal heart sounds and intact distal pulses.   Pulmonary/Chest: Effort normal and breath sounds normal. No respiratory distress. She has no wheezes. She has no rales. She exhibits no tenderness.  Lymphadenopathy:    She has no cervical adenopathy.  Neurological: She is alert and oriented to person, place, and time.  Skin: Skin is warm and dry. No rash noted.  Psychiatric: Affect normal.  Vitals reviewed.  Assessment/Plan: Sore throat Rapid strep negative.  Suspect secondary to significant PND from sinusitis.  Supportive measures discussed.  Acute sinusitis treated with antibiotics in the past 60 days Rx Levaquin QD x 10 days.  Increase fluids.  Rest.  Saline nasal spray.  Daily claritin.  Rx Promethazine-DM for cough. Diflucan for yeast infection as a result of antibiotic use.  Recommend daily probiotic.  Place humidifier in bedroom.

## 2014-01-28 NOTE — Assessment & Plan Note (Signed)
Rx Levaquin QD x 10 days.  Increase fluids.  Rest.  Saline nasal spray.  Daily claritin.  Rx Promethazine-DM for cough. Diflucan for yeast infection as a result of antibiotic use.  Recommend daily probiotic.  Place humidifier in bedroom.

## 2014-02-07 ENCOUNTER — Encounter: Payer: Self-pay | Admitting: Internal Medicine

## 2014-02-07 ENCOUNTER — Ambulatory Visit (INDEPENDENT_AMBULATORY_CARE_PROVIDER_SITE_OTHER): Payer: BC Managed Care – PPO | Admitting: Internal Medicine

## 2014-02-07 VITALS — BP 105/73 | HR 93 | Temp 98.2°F | Wt 196.4 lb

## 2014-02-07 DIAGNOSIS — F418 Other specified anxiety disorders: Secondary | ICD-10-CM

## 2014-02-07 DIAGNOSIS — F329 Major depressive disorder, single episode, unspecified: Secondary | ICD-10-CM

## 2014-02-07 DIAGNOSIS — F419 Anxiety disorder, unspecified: Secondary | ICD-10-CM

## 2014-02-07 DIAGNOSIS — H9202 Otalgia, left ear: Secondary | ICD-10-CM

## 2014-02-07 DIAGNOSIS — F32A Depression, unspecified: Secondary | ICD-10-CM

## 2014-02-07 MED ORDER — PREDNISONE 10 MG PO TABS: ORAL_TABLET | ORAL | Status: DC

## 2014-02-07 MED ORDER — ANTIPYRINE-BENZOCAINE 5.4-1.4 % OT SOLN: 3.0000 [drp] | OTIC | Status: DC | PRN

## 2014-02-07 MED ORDER — ALPRAZOLAM 1 MG PO TABS: ORAL_TABLET | ORAL | Status: DC

## 2014-02-07 NOTE — Patient Instructions (Addendum)
Go to the lab and  provide a urine sample for a UDS  Use the eardrops for few days OTC Flonase 2 sprays in each side of the nose daily for 15 days Prednisone as prescribed If not better let me know  Next visit in 4 months

## 2014-02-07 NOTE — Progress Notes (Signed)
Pre visit review using our clinic review tool, if applicable. No additional management support is needed unless otherwise documented below in the visit note. 

## 2014-02-07 NOTE — Progress Notes (Signed)
Subjective:    Patient ID: Kylie Munoz, female    DOB: September 22, 1975, 38 y.o.   MRN: [id number]  DOS:  02/07/2014 Type of visit - description : Ear pain, acute visit Interval history: The patient was seen 01/06/2014 and 01/28/2014 for respiratory symptoms. Prescribed Augmentin and subsequently Levaquin. She took both antibiotics, respiratory symptoms improved but not completely gone. She is here today because a 2 day history of left ear pain, the pain is loosly located around the left ear and the left parotid area. No facial swelling.  ROS Denies fever or chills, no sore throat. No dental pain, the patient has dentures upper and lower. She had cough, postnasal drip and sinus congestion: Although symptoms are better but not completely gone. No ear discharge  Past Medical History  Diagnosis Date  . Anxiety and depression   . Obesity   . Anxiety     Past Surgical History  Procedure Laterality Date  . Abdominal hysterectomy  2004    has one ovary  . Cesarean section  2001  . Dental surgery  2006  . Carpal tunnel release Right 2009    History   Social History  . Marital Status: Married    Spouse Name: N/A    Number of Children: 3  . Years of Education: N/A   Occupational History  . manager- restaurant     Social History Main Topics  . Smoking status: Current Every Day Smoker -- 0.50 packs/day    Types: Cigarettes  . Smokeless tobacco: Never Used  . Alcohol Use: Yes     Comment: social  . Drug Use: No  . Sexual Activity:    Partners: Male    Birth Control/ Protection: Surgical     Comment: HYST   Other Topics Concern  . Not on file   Social History Narrative        Medication List       This list is accurate as of: 02/07/14 11:59 PM.  Always use your most recent med list.               ALPRAZolam 1 MG tablet  Commonly known as:  XANAX  TAKE 1/2 TABLET BY MOUTH FOUR TIMES DAILY AS NEEDED FOR ANXIETY OR SLEEP     antipyrine-benzocaine otic  solution  Commonly known as:  AURALGAN  Place 3-4 drops into the left ear every 2 (two) hours as needed for ear pain.     COLON CLEANSE Caps  Take 2 each by mouth daily.     GARCINIA CAMBOGIA-CHROMIUM PO  Take 1,500 mg by mouth daily.     ibuprofen 200 MG tablet  Commonly known as:  ADVIL,MOTRIN  Take 200 mg by mouth every 6 (six) hours as needed (Wrist Pain).     multivitamin tablet  Take 1 tablet by mouth daily. Nature Made Women's     omega-3 fish oil 1000 MG Caps capsule  Commonly known as:  MAXEPA  Take 1 capsule by mouth daily.     predniSONE 10 MG tablet  Commonly known as:  DELTASONE  3 tabs x 3 days, 2 tabs x 3 days, 1 tab x 3 days           Objective:   Physical Exam BP 105/73 mmHg  Pulse 93  Temp(Src) 98.2 F (36.8 C) (Oral)  Wt 196 lb 6 oz (89.075 kg)  SpO2 100%  LMP 03/13/2002 General -- alert, well-developed, NAD.  Neck --no thyromegaly ,   no  LAD HEENT-- Not pale.  R Ear-- normal L ear-- TM slt bulge, no red-d/c Throat symmetric, no redness or discharge. Face symmetric, sinuses not tender to palpation. Nose not congested. TMJs without click but + TTP on the left. parotids palpated--- symmetric, no  swelling or tenderness in that area Lungs -- normal respiratory effort, no intercostal retractions, no accessory muscle use, and few rhonchi w/ cough Heart-- normal rate, regular rhythm, no murmur.  Extremities-- no pretibial edema bilaterally  Neurologic--  alert & oriented X3. Speech normal, gait appropriate for age, strength symmetric and appropriate for age.  Psych-- Cognition and judgment appear intact. Cooperative with normal attention span and concentration. No anxious or depressed appearing.        Assessment & Plan:  Otalgia, No evidence of active sinusitis or infectious otitis at this time. Pain could be TMJ related versus serous otitis. Plan: auralgan, Flonase, prednisone. If not better needs to see her dentist for TMJ  problems URI, Status post Augmentin and Levaquin, getting better

## 2014-02-08 NOTE — Assessment & Plan Note (Signed)
Needs a refill on Xanax,  printers down, provided a handwritten  paper prescription #60 no refills

## 2014-03-17 ENCOUNTER — Telehealth: Payer: Self-pay

## 2014-03-17 MED ORDER — ALPRAZOLAM 1 MG PO TABS: ORAL_TABLET | ORAL | Status: DC

## 2014-03-17 NOTE — Telephone Encounter (Signed)
Pt is requesting refill on Alprazolam.  Last OV: 02/07/2014 Last Fill: 02/07/2014 # 60 1RF UDS: None  Please advise.

## 2014-03-17 NOTE — Telephone Encounter (Signed)
Advise patient to come to the office for UDS. Last refill was 02/07/2014, #60, no refills, Okay #60, no refills today

## 2014-03-17 NOTE — Telephone Encounter (Signed)
Faxed to [facility] pharmacy.  

## 2014-04-18 ENCOUNTER — Other Ambulatory Visit: Payer: Self-pay

## 2014-04-18 MED ORDER — ALPRAZOLAM 1 MG PO TABS: ORAL_TABLET | ORAL | Status: DC

## 2014-04-18 NOTE — Telephone Encounter (Signed)
Please print #60 no refills , UDS when she comes back

## 2014-04-18 NOTE — Telephone Encounter (Signed)
Pt is requesting refill on Alprazolam.  Last OV: 02/07/2014 Last Fill: 03/17/2014 # 60 0RF UDS: None  Please advise.

## 2014-04-18 NOTE — Telephone Encounter (Signed)
Faxed to [facility] pharmacy.  

## 2014-04-18 NOTE — Telephone Encounter (Signed)
Printed, awaiting signature by Dr. Paz.  

## 2014-05-19 ENCOUNTER — Telehealth: Payer: Self-pay

## 2014-05-19 NOTE — Telephone Encounter (Signed)
Closed in error. Please see below.

## 2014-05-19 NOTE — Telephone Encounter (Signed)
Pt is requesting refill on Alprazolam.  Last OV: 02/07/2014 Last Fill: 04/18/2014 # 60 0RF UDS: None  NEEDS UDS AND CONTRACT   Please advise.

## 2014-05-20 MED ORDER — ALPRAZOLAM 1 MG PO TABS: ORAL_TABLET | ORAL | Status: DC

## 2014-05-20 NOTE — Telephone Encounter (Signed)
LMOM informing Pt that Rx is ready for pick up, that she will need to come pick up Rx this time for controlled substance contract and UDS. Rx placed at front desk for pick up.

## 2014-05-20 NOTE — Telephone Encounter (Signed)
Rx printed, awaiting signature by Dr. Paz.  

## 2014-05-20 NOTE — Telephone Encounter (Addendum)
Ok # 60, please asked patient to come  for a contract and UDS

## 2014-05-20 NOTE — Addendum Note (Signed)
Addended by: Dorette GrateFAULKNER, Rohith Fauth C on: 05/20/2014 10:49 AM   Modules accepted: Orders

## 2014-06-03 ENCOUNTER — Ambulatory Visit: Payer: Self-pay | Admitting: Physician Assistant

## 2014-06-06 ENCOUNTER — Encounter: Payer: Self-pay | Admitting: Physician Assistant

## 2014-06-06 ENCOUNTER — Ambulatory Visit (INDEPENDENT_AMBULATORY_CARE_PROVIDER_SITE_OTHER): Payer: BLUE CROSS/BLUE SHIELD | Admitting: Physician Assistant

## 2014-06-06 VITALS — BP 137/80 | HR 75 | Temp 98.1°F | Resp 16 | Ht 66.0 in | Wt 195.2 lb

## 2014-06-06 DIAGNOSIS — B9689 Other specified bacterial agents as the cause of diseases classified elsewhere: Secondary | ICD-10-CM

## 2014-06-06 DIAGNOSIS — J019 Acute sinusitis, unspecified: Secondary | ICD-10-CM | POA: Diagnosis not present

## 2014-06-06 MED ORDER — METHYLPREDNISOLONE ACETATE 40 MG/ML IJ SUSP
40.0000 mg | Freq: Once | INTRAMUSCULAR | Status: AC
  Administered 2014-06-06: 40 mg via INTRAMUSCULAR

## 2014-06-06 MED ORDER — FLUCONAZOLE 150 MG PO TABS: 150.0000 mg | ORAL_TABLET | Freq: Once | ORAL | Status: DC

## 2014-06-06 MED ORDER — FLUTICASONE PROPIONATE 50 MCG/ACT NA SUSP: 2.0000 | Freq: Every day | NASAL | Status: DC

## 2014-06-06 MED ORDER — DM-GUAIFENESIN ER 30-600 MG PO TB12: 1.0000 | ORAL_TABLET | Freq: Two times a day (BID) | ORAL | Status: DC

## 2014-06-06 MED ORDER — AMOXICILLIN-POT CLAVULANATE 875-125 MG PO TABS: 1.0000 | ORAL_TABLET | Freq: Two times a day (BID) | ORAL | Status: DC

## 2014-06-06 NOTE — Assessment & Plan Note (Signed)
Rx Augmentin.  Increase fluids.  Rest.  Saline nasal spray.  Probiotic.  Mucinex as directed.  Humidifier in bedroom. Flonase daily.  Call or return to clinic if symptoms are not improving.  

## 2014-06-06 NOTE — Addendum Note (Signed)
Addended by: Marcelline MatesMARTIN, Kairi Tufo on: 06/06/2014 03:17 PM   Modules accepted: Orders, SmartSet

## 2014-06-06 NOTE — Progress Notes (Signed)
History of Present Illness: Kylie Munoz is a [age] y.o. female who present to the clinic today complaining of sinus pressure, sinus pain and nasal congestion x 1 week. Patient endorses symptoms initially improved but took a turn for the worse 4 days ago.  Patient denies chest pain or shortness of breath.  Denies fever.  Is having ear pain and tooth pain.   History: Past Medical History  Diagnosis Date  . Anxiety and depression   . Obesity   . Anxiety     Current outpatient prescriptions:  .  ALPRAZolam (XANAX) 1 MG tablet, TAKE 1/2 TABLET BY MOUTH FOUR TIMES DAILY AS NEEDED FOR ANXIETY OR SLEEP, Disp: 60 tablet, Rfl: 0 .  amoxicillin-clavulanate (AUGMENTIN) 875-125 MG per tablet, Take 1 tablet by mouth 2 (two) times daily., Disp: 20 tablet, Rfl: 0 .  antipyrine-benzocaine (AURALGAN) otic solution, Place 3-4 drops into the left ear every 2 (two) hours as needed for ear pain., Disp: 10 mL, Rfl: 0 .  dextromethorphan-guaiFENesin (MUCINEX DM) 30-600 MG per 12 hr tablet, Take 1 tablet by mouth 2 (two) times daily., Disp: 30 tablet, Rfl: 0 .  fluticasone (FLONASE) 50 MCG/ACT nasal spray, Place 2 sprays into both nostrils daily., Disp: 16 g, Rfl: 6 .  GARCINIA CAMBOGIA-CHROMIUM PO, Take 1,500 mg by mouth daily., Disp: , Rfl:  .  ibuprofen (ADVIL,MOTRIN) 200 MG tablet, Take 200 mg by mouth every 6 (six) hours as needed (Wrist Pain)., Disp: , Rfl:  .  Misc Natural Products (COLON CLEANSE) CAPS, Take 2 each by mouth daily., Disp: , Rfl:  .  Multiple Vitamin (MULTIVITAMIN) tablet, Take 1 tablet by mouth daily. Nature Made Women's, Disp: , Rfl:  .  omega-3 fish oil (MAXEPA) 1000 MG CAPS capsule, Take 1 capsule by mouth daily., Disp: , Rfl:  .  predniSONE (DELTASONE) 10 MG tablet, 3 tabs x 3 days, 2 tabs x 3 days, 1 tab x 3 days, Disp: 18 tablet, Rfl: 0 Allergies  Allergen Reactions  . Iohexol      Code: HIVES, Desc: PT had a near syncope event,developed hives & difficulty swallowing post injection of  125 ml Omni 300.Treated by Dr.McAlhany with 50 mg PO, 25mg  IV benedryl, 125 mg solu-cortef, 0.3 mg Epinephrine IM, 40 mg famotidine PO. [facility], Onset Date: [id number]   . Tramadol Hcl     REACTION: itching   Family History  Problem Relation Age of Onset  . Hypertension Mother   . Diabetes Mother   . Heart failure Mother   . Breast cancer Mother     2006 bilat mast; dx in late 3840s and again in late 3050s  . Lung cancer Mother   . Hypertension Father   . Diabetes Father   . Heart failure Father   . Breast cancer Maternal Grandmother     bilateral cancer; diagnosed in mid 30s and again in her 2350s  . Hypertension Maternal Grandmother   . Diabetes Maternal Grandmother   . Hypertension Maternal Grandfather   . Breast cancer Paternal Grandmother [id number]  . Stroke Paternal Grandmother   . Hypertension Paternal Grandmother   . Diabetes Paternal Grandmother   . Heart failure Paternal Grandmother   . Hypertension Paternal Grandfather   . Ovarian cancer Paternal Aunt     paternal grandmother's sister  . Ovarian cancer Maternal Aunt     maternal grandmother's sister   History   Social History  . Marital Status: Married    Spouse  Name: N/A  . Number of Children: 3  . Years of Education: N/A   Occupational History  . manager- restaurant     Social History Main Topics  . Smoking status: Current Every Day Smoker -- 0.50 packs/day    Types: Cigarettes  . Smokeless tobacco: Never Used  . Alcohol Use: Yes     Comment: social  . Drug Use: No  . Sexual Activity:    Partners: Male    Birth Control/ Protection: Surgical     Comment: HYST   Other Topics Concern  . None   Social History Narrative    Review of Systems: See HPI.  All other ROS are negative.  Physical Examination: BP 137/80 mmHg  Pulse 75  Temp(Src) 98.1 F (36.7 C) (Oral)  Resp 16  Ht  (1.676 m)  Wt 195 lb 4 oz (88.565 kg)  BMI 31.53 kg/m2  SpO2 100%  LMP 03/13/2002  General  appearance: alert, cooperative and appears stated age Head: Normocephalic, without obvious abnormality, atraumatic, sinuses tender to percussion Eyes: conjunctivae/corneas clear. PERRL, EOM's intact. Fundi benign. Ears: normal TM's and external ear canals both ears Nose: moderate congestion, turbinates swollen, sinus tenderness bilateral Throat: lips, mucosa, and tongue normal; teeth and gums normal Neck: no adenopathy, no carotid bruit, no JVD, supple, symmetrical, trachea midline and thyroid not enlarged, symmetric, no tenderness/mass/nodules Lungs: clear to auscultation bilaterally Chest wall: no tenderness  Assessment/Plan: Acute bacterial sinusitis Rx Augmentin.  Increase fluids.  Rest.  Saline nasal spray.  Probiotic.  Mucinex as directed.  Humidifier in bedroom. Flonase daily.  Call or return to clinic if symptoms are not improving.

## 2014-06-06 NOTE — Addendum Note (Signed)
Addended by: Regis BillSCATES, SHARON L on: 06/06/2014 03:17 PM   Modules accepted: Orders

## 2014-06-06 NOTE — Patient Instructions (Signed)
Please take antibiotic as directed.  Increase fluid intake.  Use Saline nasal spray.  Take a daily multivitamin. Use Mucinex-DM as directed.  Use the Flonase daily.  Place a humidifier in the bedroom.  Please call or return clinic if symptoms are not improving.  Sinusitis Sinusitis is redness, soreness, and swelling (inflammation) of the paranasal sinuses. Paranasal sinuses are air pockets within the bones of your face (beneath the eyes, the middle of the forehead, or above the eyes). In healthy paranasal sinuses, mucus is able to drain out, and air is able to circulate through them by way of your nose. However, when your paranasal sinuses are inflamed, mucus and air can become trapped. This can allow bacteria and other germs to grow and cause infection. Sinusitis can develop quickly and last only a short time (acute) or continue over a long period (chronic). Sinusitis that lasts for more than 12 weeks is considered chronic.  CAUSES  Causes of sinusitis include:  Allergies.  Structural abnormalities, such as displacement of the cartilage that separates your nostrils (deviated septum), which can decrease the air flow through your nose and sinuses and affect sinus drainage.  Functional abnormalities, such as when the small hairs (cilia) that line your sinuses and help remove mucus do not work properly or are not present. SYMPTOMS  Symptoms of acute and chronic sinusitis are the same. The primary symptoms are pain and pressure around the affected sinuses. Other symptoms include:  Upper toothache.  Earache.  Headache.  Bad breath.  Decreased sense of smell and taste.  A cough, which worsens when you are lying flat.  Fatigue.  Fever.  Thick drainage from your nose, which often is green and may contain pus (purulent).  Swelling and warmth over the affected sinuses. DIAGNOSIS  Your caregiver will perform a physical exam. During the exam, your caregiver may:  Look in your nose for signs  of abnormal growths in your nostrils (nasal polyps).  Tap over the affected sinus to check for signs of infection.  View the inside of your sinuses (endoscopy) with a special imaging device with a light attached (endoscope), which is inserted into your sinuses. If your caregiver suspects that you have chronic sinusitis, one or more of the following tests may be recommended:  Allergy tests.  Nasal culture A sample of mucus is taken from your nose and sent to a lab and screened for bacteria.  Nasal cytology A sample of mucus is taken from your nose and examined by your caregiver to determine if your sinusitis is related to an allergy. TREATMENT  Most cases of acute sinusitis are related to a viral infection and will resolve on their own within 10 days. Sometimes medicines are prescribed to help relieve symptoms (pain medicine, decongestants, nasal steroid sprays, or saline sprays).  However, for sinusitis related to a bacterial infection, your caregiver will prescribe antibiotic medicines. These are medicines that will help kill the bacteria causing the infection.  Rarely, sinusitis is caused by a fungal infection. In theses cases, your caregiver will prescribe antifungal medicine. For some cases of chronic sinusitis, surgery is needed. Generally, these are cases in which sinusitis recurs more than 3 times per year, despite other treatments. HOME CARE INSTRUCTIONS   Drink plenty of water. Water helps thin the mucus so your sinuses can drain more easily.  Use a humidifier.  Inhale steam 3 to 4 times a day (for example, sit in the bathroom with the shower running).  Apply a warm, moist washcloth  to your face 3 to 4 times a day, or as directed by your caregiver.  Use saline nasal sprays to help moisten and clean your sinuses.  Take over-the-counter or prescription medicines for pain, discomfort, or fever only as directed by your caregiver. SEEK IMMEDIATE MEDICAL CARE IF:  You have  increasing pain or severe headaches.  You have nausea, vomiting, or drowsiness.  You have swelling around your face.  You have vision problems.  You have a stiff neck.  You have difficulty breathing. MAKE SURE YOU:   Understand these instructions.  Will watch your condition.  Will get help right away if you are not doing well or get worse. Document Released: 02/14/2005 Document Revised: 05/09/2011 Document Reviewed: 03/01/2011 [facility] Patient Information 2014 [place], [place].

## 2014-06-09 ENCOUNTER — Telehealth: Payer: Self-pay | Admitting: Internal Medicine

## 2014-06-09 ENCOUNTER — Ambulatory Visit: Payer: Self-pay | Admitting: Physician Assistant

## 2014-06-09 NOTE — Telephone Encounter (Signed)
Pt was no show for acute visit on 06/03/14- was seen on 06/06/14- no letter sent- charge PT?

## 2014-06-09 NOTE — Telephone Encounter (Signed)
No charge. 

## 2014-06-10 ENCOUNTER — Telehealth: Payer: Self-pay

## 2014-06-10 NOTE — Telephone Encounter (Signed)
UDS: 05/29/2014  Negative for Alprazolam: Refilled regularly   Moderate risk per Dr. Drue NovelPaz 06/10/2014

## 2014-06-30 ENCOUNTER — Telehealth: Payer: Self-pay

## 2014-06-30 MED ORDER — ALPRAZOLAM 1 MG PO TABS: 0.5000 mg | ORAL_TABLET | Freq: Four times a day (QID) | ORAL | Status: DC | PRN

## 2014-06-30 NOTE — Telephone Encounter (Signed)
Rx printed, awaiting MD signature.  

## 2014-06-30 NOTE — Telephone Encounter (Signed)
Ok 60,  No RF

## 2014-06-30 NOTE — Telephone Encounter (Signed)
Rx faxed to [facility] pharmacy.  

## 2014-06-30 NOTE — Telephone Encounter (Signed)
Pt is requesting refills on Alprazolam.  Last OV: 02/07/2014  Last Fill: 05/20/2014 #60 0RF UDS: 05/29/2014 Moderate risk  Please advise.

## 2014-07-11 ENCOUNTER — Telehealth: Payer: Self-pay | Admitting: *Deleted

## 2014-07-11 NOTE — Telephone Encounter (Signed)
Agree, needs to be seen if not better

## 2014-07-11 NOTE — Telephone Encounter (Signed)
FYI- Patient called in complaining of abdominal pain and bloating that began last night.  She states that the pain is mild and she feels that if she could pass gas it would be relieved.  She does have some nausea, she has vomited once (but made herself vomit to see if it would provide relief- it did not).  She states that her last BM was today and it looked normal.  She describes the pain as dull and cramping, feels like menstrual cramps but she has had a hysterectomy.  She denies urinary symptoms.  No fevers.  Patient states that she has had coworkers out with a stomach bug and wondered if symptoms were consistent with this.  Offered patient appointment with PA this afternoon, but patient declined stating that she wanted to see if she felt better and will call office if symptoms are not relieved this afternoon.

## 2014-08-01 ENCOUNTER — Telehealth: Payer: Self-pay

## 2014-08-01 MED ORDER — ALPRAZOLAM 1 MG PO TABS: 0.5000 mg | ORAL_TABLET | Freq: Four times a day (QID) | ORAL | Status: DC | PRN

## 2014-08-01 NOTE — Telephone Encounter (Signed)
Pt is requesting refill on Alprazolam.  Last OV: 02/07/2014 Last Fill: 06/30/2014 #60 0RF UDS: 05/29/2014 Moderate risk  Please advise.

## 2014-08-01 NOTE — Telephone Encounter (Signed)
Rx faxed to [facility] pharmacy.  

## 2014-08-01 NOTE — Telephone Encounter (Signed)
Rx printed, awaiting MD signature.  

## 2014-08-01 NOTE — Telephone Encounter (Signed)
60, no refills

## 2014-09-04 ENCOUNTER — Telehealth: Payer: Self-pay | Admitting: Internal Medicine

## 2014-09-04 NOTE — Telephone Encounter (Signed)
Caller name: Sharifah Relation to pt: Call back number:  [phone] Pharmacy: [facility] [address]  Reason for call:   Patient requesting alprazolam refill

## 2014-09-04 NOTE — Telephone Encounter (Signed)
Left message for patient to return my call.

## 2014-09-04 NOTE — Telephone Encounter (Signed)
Pt called in. I informed her no further refills until she comes in for appt. She states that she was not informed prior. Offered appt tomorrow. Pt says she will call back tonight or tomorrow to schedule.

## 2014-09-04 NOTE — Telephone Encounter (Signed)
Pt informed in June that no further refills until she has been seen. She will need to be seen before we can give refill.

## 2014-09-05 ENCOUNTER — Other Ambulatory Visit: Payer: Self-pay

## 2014-09-05 NOTE — Telephone Encounter (Signed)
Pt informed via last Rx, I apologize if her pharmacy did not inform her.

## 2014-09-08 ENCOUNTER — Ambulatory Visit: Payer: BLUE CROSS/BLUE SHIELD | Admitting: Internal Medicine

## 2014-09-08 ENCOUNTER — Telehealth: Payer: Self-pay

## 2014-09-08 MED ORDER — ALPRAZOLAM 1 MG PO TABS: 0.5000 mg | ORAL_TABLET | Freq: Four times a day (QID) | ORAL | Status: DC | PRN

## 2014-09-08 NOTE — Telephone Encounter (Signed)
Pt is requesting refill on Alprazolam.  Last OV: 02/07/2014, Pt has F/U appt scheduled today (09/08/2014 at 2:15 PM) Last Fill: 08/01/2014 #60 0RF UDS: 05/29/2014 Moderate risk  Please advise.

## 2014-09-08 NOTE — Telephone Encounter (Signed)
LMOM expressing my condolences for her loss. Informed her Dr. Drue NovelPaz did approve a 1 month supply of Xanax, and [facility]. Informed her that Dr. Drue NovelPaz would like to see her within one month just for a follow-up and also informed her that if she just needs to talk with someone we are here for her.

## 2014-09-08 NOTE — Telephone Encounter (Signed)
Spoke with Dr. Drue NovelPaz, okay to print 1 month supply. Rx printed, awaiting MD signature.

## 2014-09-08 NOTE — Telephone Encounter (Signed)
Caller name: Mila HomerMary Beaird Relationship to patient: self Can be reached: [phone] Pharmacy: [facility] [address] [place]  Reason for call: Pt had a death in her family and will not be able to come in today. She is asking if she can get refill on meds below. She requested a call from Dr. Drue NovelPaz.

## 2014-09-08 NOTE — Addendum Note (Signed)
Addended by: Dorette GrateFAULKNER, Lylah Lantis C on: 09/08/2014 01:57 PM   Modules accepted: Orders

## 2014-09-08 NOTE — Telephone Encounter (Signed)
We'll refill at today's visit

## 2014-09-09 ENCOUNTER — Telehealth: Payer: Self-pay | Admitting: Internal Medicine

## 2014-09-09 NOTE — Telephone Encounter (Signed)
Pt was no show 09/08/14 2:15pm, pt called in 09/09/14 stating she had a death in the family, charge no show fee?

## 2014-09-10 NOTE — Telephone Encounter (Signed)
No, thx 

## 2014-10-27 ENCOUNTER — Telehealth: Payer: Self-pay | Admitting: Internal Medicine

## 2014-10-27 ENCOUNTER — Ambulatory Visit: Payer: BLUE CROSS/BLUE SHIELD | Admitting: Internal Medicine

## 2014-10-27 NOTE — Telephone Encounter (Signed)
Pt will be no show today 10/27/14 2:30pm, follow up appt, pt called in 8/29 11:30am to cancel, advise possible $50 no show fee, pt states she isn't able to get off work, charge for no show?

## 2014-10-27 NOTE — Telephone Encounter (Signed)
No , is ok  

## 2014-12-05 ENCOUNTER — Ambulatory Visit (INDEPENDENT_AMBULATORY_CARE_PROVIDER_SITE_OTHER): Payer: BLUE CROSS/BLUE SHIELD | Admitting: Internal Medicine

## 2014-12-05 ENCOUNTER — Encounter: Payer: Self-pay | Admitting: Internal Medicine

## 2014-12-05 VITALS — BP 114/82 | HR 72 | Temp 97.7°F | Ht 66.0 in | Wt 194.5 lb

## 2014-12-05 DIAGNOSIS — F329 Major depressive disorder, single episode, unspecified: Secondary | ICD-10-CM

## 2014-12-05 DIAGNOSIS — F418 Other specified anxiety disorders: Secondary | ICD-10-CM | POA: Diagnosis not present

## 2014-12-05 DIAGNOSIS — F419 Anxiety disorder, unspecified: Principal | ICD-10-CM

## 2014-12-05 DIAGNOSIS — Z09 Encounter for follow-up examination after completed treatment for conditions other than malignant neoplasm: Secondary | ICD-10-CM

## 2014-12-05 MED ORDER — ALPRAZOLAM 1 MG PO TABS: 0.5000 mg | ORAL_TABLET | Freq: Four times a day (QID) | ORAL | Status: DC | PRN

## 2014-12-05 NOTE — Progress Notes (Signed)
Subjective:    Patient ID: Kylie Munoz, female    DOB: 05/30/1975, 39 y.o.   MRN: [id number]  DOS:  12/05/2014 Type of visit - description : Routine checkup Interval history: Since the last visit, she lost her 33 year old brother unexpectedly, additionally lost her job ++ stress, anxiety increased, no major problems with depression. No suicidal ideas   Review of Systems   Past Medical History  Diagnosis Date  . Anxiety and depression   . Obesity   . Anxiety     Past Surgical History  Procedure Laterality Date  . Abdominal hysterectomy  2004    has one ovary  . Cesarean section  2001  . Dental surgery  2006  . Carpal tunnel release Right 2009    Social History   Social History  . Marital Status: Married    Spouse Name: N/A  . Number of Children: 3  . Years of Education: N/A   Occupational History  . Development worker, community , lost job ~09-2014    Social History Main Topics  . Smoking status: Current Every Day Smoker -- 0.50 packs/day    Types: Cigarettes  . Smokeless tobacco: Never Used  . Alcohol Use: Yes     Comment: social  . Drug Use: No  . Sexual Activity:    Partners: Male    Birth Control/ Protection: Surgical     Comment: HYST   Other Topics Concern  . Not on file   Social History Narrative   Single mom         Medication List       This list is accurate as of: 12/05/14 11:59 PM.  Always use your most recent med list.               ALPRAZolam 1 MG tablet  Commonly known as:  XANAX  Take 0.5 tablets (0.5 mg total) by mouth 4 (four) times daily as needed for anxiety or sleep.     COLON CLEANSE Caps  Take 2 each by mouth daily.     fluticasone 50 MCG/ACT nasal spray  Commonly known as:  FLONASE  Place 2 sprays into both nostrils daily.     GARCINIA CAMBOGIA-CHROMIUM PO  Take 1,500 mg by mouth daily.     ibuprofen 200 MG tablet  Commonly known as:  ADVIL,MOTRIN  Take 200 mg by mouth every 6 (six) hours as needed (Wrist Pain).     multivitamin tablet  Take 1 tablet by mouth daily. Nature Made Women's     omega-3 fish oil 1000 MG Caps capsule  Commonly known as:  MAXEPA  Take 1 capsule by mouth daily.           Objective:   Physical Exam BP 114/82 mmHg  Pulse 72  Temp(Src) 97.7 F (36.5 C) (Oral)  Ht  (1.676 m)  Wt 194 lb 8 oz (88.225 kg)  BMI 31.41 kg/m2  SpO2 99%  LMP 03/13/2002 General:   Well developed, well nourished . NAD.  HEENT:  Normocephalic . Face symmetric, atraumatic Neurologic:  alert & oriented X3.  Speech normal, gait appropriate for age and unassisted Psych--  Cognition and judgment appear intact.  Cooperative with normal attention span and concentration.  Behavior appropriate. Tearful during parts of the visit, anxious appearing but not depressed appearing.      Assessment & Plan:   Assessment> Anxiety and depression: years ago tried wellbutrin ("didn't feel right"), also SSRIs (didn't work for her) H/o abnormal Pap smears +  FH Lung ca (mother)  Plan: Anxiety and depression: Counselor to the best of my ability, refill Xanax, UDS. Smoker: Counseling, maybe this is not the right time to quit but I encouraged moderation, okay to use a nicotine supplement to help her. RTC 3 months  Today, I spent more than  20   min with the patient: >50% of the time counseling regards anxiety and tobacco abuse. Listening to her concerns

## 2014-12-05 NOTE — Progress Notes (Signed)
Pre visit review using our clinic review tool, if applicable. No additional management support is needed unless otherwise documented below in the visit note. 

## 2014-12-05 NOTE — Patient Instructions (Signed)
    Next visit  for a    Routine check up in 3-4 months , no fating   Please schedule an appointment at the front desk

## 2014-12-06 DIAGNOSIS — Z09 Encounter for follow-up examination after completed treatment for conditions other than malignant neoplasm: Secondary | ICD-10-CM | POA: Insufficient documentation

## 2014-12-06 NOTE — Assessment & Plan Note (Signed)
Anxiety and depression: Counselor to the best of my ability, refill Xanax, UDS. Smoker: Counseling, maybe this is not the right time to quit but I encouraged moderation, okay to use a nicotine supplement to help her. RTC 3 months

## 2014-12-11 ENCOUNTER — Telehealth: Payer: Self-pay

## 2014-12-11 NOTE — Telephone Encounter (Signed)
UDS: 12/04/2014   Negative for Alprazolam: PRN   Low risk per Dr. Drue NovelPaz 12/11/2014

## 2015-02-19 ENCOUNTER — Telehealth: Payer: Self-pay | Admitting: Internal Medicine

## 2015-02-19 MED ORDER — ALPRAZOLAM 1 MG PO TABS: 0.5000 mg | ORAL_TABLET | Freq: Four times a day (QID) | ORAL | Status: DC | PRN

## 2015-02-19 NOTE — Telephone Encounter (Signed)
[facility]

## 2015-02-19 NOTE — Telephone Encounter (Signed)
Last refill was done on 12/05/2014  #60 with 2 refills.

## 2015-02-19 NOTE — Telephone Encounter (Signed)
Printed #60 and 3 refills

## 2015-02-19 NOTE — Telephone Encounter (Signed)
Relation to ZO:XWRUpt:self Call back number:[phone] [phone] [place],  - [address] [facility] [address] [phone] (Phone) [phone] (Fax)         Reason for call:  Patient states she is aware she is not due for ALPRAZolam [name](XANAX) 1 MG tablet but the pharmacy is informing her she has no refills and patient does not want to run into any problems do to the holiday. Patient would like office to call pharmacy directly to clarify the refill confusion.

## 2015-02-26 ENCOUNTER — Emergency Department (HOSPITAL_BASED_OUTPATIENT_CLINIC_OR_DEPARTMENT_OTHER): Payer: BLUE CROSS/BLUE SHIELD

## 2015-02-26 ENCOUNTER — Emergency Department (HOSPITAL_BASED_OUTPATIENT_CLINIC_OR_DEPARTMENT_OTHER)
Admission: EM | Admit: 2015-02-26 | Discharge: 2015-02-26 | Disposition: A | Discharge status: Home / Self Care | Payer: BLUE CROSS/BLUE SHIELD | Attending: Emergency Medicine | Admitting: Emergency Medicine

## 2015-02-26 ENCOUNTER — Encounter (HOSPITAL_BASED_OUTPATIENT_CLINIC_OR_DEPARTMENT_OTHER): Payer: Self-pay

## 2015-02-26 ENCOUNTER — Telehealth: Payer: Self-pay | Admitting: Internal Medicine

## 2015-02-26 DIAGNOSIS — Z7951 Long term (current) use of inhaled steroids: Secondary | ICD-10-CM | POA: Diagnosis not present

## 2015-02-26 DIAGNOSIS — R200 Anesthesia of skin: Secondary | ICD-10-CM | POA: Diagnosis not present

## 2015-02-26 DIAGNOSIS — Z79899 Other long term (current) drug therapy: Secondary | ICD-10-CM | POA: Insufficient documentation

## 2015-02-26 DIAGNOSIS — R42 Dizziness and giddiness: Secondary | ICD-10-CM | POA: Insufficient documentation

## 2015-02-26 DIAGNOSIS — R112 Nausea with vomiting, unspecified: Secondary | ICD-10-CM | POA: Insufficient documentation

## 2015-02-26 DIAGNOSIS — R0602 Shortness of breath: Secondary | ICD-10-CM | POA: Diagnosis not present

## 2015-02-26 DIAGNOSIS — R1013 Epigastric pain: Secondary | ICD-10-CM | POA: Diagnosis not present

## 2015-02-26 DIAGNOSIS — E669 Obesity, unspecified: Secondary | ICD-10-CM | POA: Diagnosis not present

## 2015-02-26 DIAGNOSIS — F329 Major depressive disorder, single episode, unspecified: Secondary | ICD-10-CM | POA: Insufficient documentation

## 2015-02-26 DIAGNOSIS — F419 Anxiety disorder, unspecified: Secondary | ICD-10-CM | POA: Diagnosis not present

## 2015-02-26 DIAGNOSIS — Z87891 Personal history of nicotine dependence: Secondary | ICD-10-CM | POA: Diagnosis not present

## 2015-02-26 LAB — CBC
HCT: 46.4 % — ABNORMAL HIGH (ref 36.0–46.0)
Hemoglobin: 16.1 g/dL — ABNORMAL HIGH (ref 12.0–15.0)
MCH: 31.6 pg (ref 26.0–34.0)
MCHC: 34.7 g/dL (ref 30.0–36.0)
MCV: 91 fL (ref 78.0–100.0)
Platelets: 248 10*3/uL (ref 150–400)
RBC: 5.1 MIL/uL (ref 3.87–5.11)
RDW: 12.6 % (ref 11.5–15.5)
WBC: 15.4 10*3/uL — ABNORMAL HIGH (ref 4.0–10.5)

## 2015-02-26 LAB — COMPREHENSIVE METABOLIC PANEL
ALT: 16 U/L (ref 14–54)
AST: 25 U/L (ref 15–41)
Albumin: 4.8 g/dL (ref 3.5–5.0)
Alkaline Phosphatase: 68 U/L (ref 38–126)
Anion gap: 12 (ref 5–15)
BUN: 7 mg/dL (ref 6–20)
CO2: 19 mmol/L — ABNORMAL LOW (ref 22–32)
Calcium: 9.7 mg/dL (ref 8.9–10.3)
Chloride: 110 mmol/L (ref 101–111)
Creatinine, Ser: 0.6 mg/dL (ref 0.44–1.00)
GFR calc Af Amer: >60 mL/min (ref >60)
GFR calc non Af Amer: >60 mL/min (ref >60)
Glucose, Bld: 137 mg/dL — ABNORMAL HIGH (ref 65–99)
Potassium: 3.5 mmol/L (ref 3.5–5.1)
Sodium: 141 mmol/L (ref 135–145)
Total Bilirubin: 1 mg/dL (ref 0.3–1.2)
Total Protein: 7.9 g/dL (ref 6.5–8.1)

## 2015-02-26 LAB — TROPONIN I: Troponin I: <0.03 ng/mL (ref <0.031)

## 2015-02-26 LAB — LIPASE, BLOOD: Lipase: 29 U/L (ref 11–51)

## 2015-02-26 MED ORDER — HYDROMORPHONE HCL 1 MG/ML IJ SOLN
0.5000 mg | Freq: Once | INTRAMUSCULAR | Status: AC
  Administered 2015-02-26: 0.5 mg via INTRAVENOUS
  Filled 2015-02-26: qty 1

## 2015-02-26 MED ORDER — ONDANSETRON HCL 4 MG/2ML IJ SOLN
4.0000 mg | Freq: Once | INTRAMUSCULAR | Status: AC | PRN
  Administered 2015-02-26: 4 mg via INTRAVENOUS
  Filled 2015-02-26: qty 2

## 2015-02-26 MED ORDER — LORAZEPAM 2 MG/ML IJ SOLN
1.0000 mg | Freq: Once | INTRAMUSCULAR | Status: AC
  Administered 2015-02-26: 1 mg via INTRAVENOUS
  Filled 2015-02-26: qty 1

## 2015-02-26 MED ORDER — PROMETHAZINE HCL 25 MG/ML IJ SOLN
12.5000 mg | Freq: Once | INTRAMUSCULAR | Status: AC
  Administered 2015-02-26: 12.5 mg via INTRAVENOUS
  Filled 2015-02-26: qty 1

## 2015-02-26 MED ORDER — ONDANSETRON HCL 4 MG PO TABS: 4.0000 mg | ORAL_TABLET | Freq: Four times a day (QID) | ORAL | Status: DC | PRN

## 2015-02-26 MED ORDER — SODIUM CHLORIDE 0.9 % IV BOLUS (SEPSIS)
1000.0000 mL | Freq: Once | INTRAVENOUS | Status: AC
  Administered 2015-02-26: 1000 mL via INTRAVENOUS

## 2015-02-26 NOTE — ED Notes (Signed)
Pt reports vomiting since last night.  Reports chest pain that started this am.  Pt is hyperventilating and crying.  Advised to slow her breathing due to that could be causing the SOB and dizziness.

## 2015-02-26 NOTE — ED Notes (Signed)
Please disregard in/out cath, entered on wrong patient.

## 2015-02-26 NOTE — ED Notes (Signed)
Pt reports nausea meds have not helped.  Pt now actively dry heaving after coming back from xray.

## 2015-02-26 NOTE — Discharge Instructions (Signed)

## 2015-02-26 NOTE — ED Notes (Signed)
MD at bedside. 

## 2015-02-26 NOTE — Telephone Encounter (Signed)
Patient Name: Kylie Munoz DOB: [birth year] Initial Comment Caller states she started vomiting Mila Homerlast night and has vomited every 30 minutes until she is dry heaving. She has the chills and is sweating. Nurse Assessment Nurse: Charna Elizabethrumbull, RN, Cathy Date/Time (Eastern Time): 02/26/2015 7:05:13 AM Confirm and document reason for call. If symptomatic, describe symptoms. ---Caller states she has about 30 episodes of vomiting and about 3 episodes of diarrhea. No known fever. Has the patient traveled out of the country within the last 30 days? ---No Does the patient have any new or worsening symptoms? ---Yes Will a triage be completed? ---Yes Related visit to physician within the last 2 weeks? ---No Does the PT have any chronic conditions? (i.e. diabetes, asthma, etc.) ---No Did the patient indicate they were pregnant? ---No Is this a behavioral health or substance abuse call? ---No Guidelines Guideline Title Affirmed Question Affirmed Notes Vomiting [1] SEVERE vomiting (e.g., 6 or more times/day) AND [2] present > 8 hours Final Disposition User Go to ED Now (or PCP triage) Charna Elizabethrumbull, RN, [facility] Disagree/Comply: Comply

## 2015-03-12 NOTE — ED Provider Notes (Signed)
CSN: [id number]     Arrival date & time 02/26/15  4098 History   First MD Initiated Contact with Patient 02/26/15 0804     Chief Complaint  Patient presents with  . Emesis     (Consider location/radiation/quality/duration/timing/severity/associated sxs/prior Treatment) HPI   40 year old female with abdominal pain. Epigastric. Onset last night. Relatively constant since then. Radiates up into her chest. Says her nausea and vomiting. Also complaining of numbness in bilateral hands and feet. This is intermittent. It was somewhat short of breath and dizzy. No fevers or chills. No cough. No urinary complaints. No unusual vaginal bleeding or discharge. Has not tried taking anything for her symptoms.  Past Medical History  Diagnosis Date  . Anxiety and depression   . Obesity   . Anxiety    Past Surgical History  Procedure Laterality Date  . Abdominal hysterectomy  2004    has one ovary  . Cesarean section  2001  . Dental surgery  2006  . Carpal tunnel release Right 2009   Family History  Problem Relation Age of Onset  . Hypertension Mother   . Diabetes Mother   . Heart failure Mother   . Breast cancer Mother     2006 bilat mast; dx in late 41s and again in late 65s  . Lung cancer Mother   . Hypertension Father   . Diabetes Father   . Heart failure Father   . Breast cancer Maternal Grandmother     bilateral cancer; diagnosed in mid 30s and again in her 65s  . Hypertension Maternal Grandmother   . Diabetes Maternal Grandmother   . Hypertension Maternal Grandfather   . Breast cancer Paternal Grandmother 30  . Stroke Paternal Grandmother   . Hypertension Paternal Grandmother   . Diabetes Paternal Grandmother   . Heart failure Paternal Grandmother   . Hypertension Paternal Grandfather   . Ovarian cancer Paternal Aunt     paternal grandmother's sister  . Ovarian cancer Maternal Aunt     maternal grandmother's sister   Social History  Substance Use Topics  . Smoking  status: Former Smoker -- 0.50 packs/day    Types: Cigarettes    Quit date: 02/12/2015  . Smokeless tobacco: Never Used  . Alcohol Use: Yes     Comment: social   OB History    Gravida Para Term Preterm AB TAB SAB Ectopic Multiple Living   2 2        2      Review of Systems  All systems reviewed and negative, other than as noted in HPI.   Allergies  Iohexol and Tramadol hcl  Home Medications   Prior to Admission medications   Medication Sig Start Date End Date Taking? Authorizing Provider  ALPRAZolam [name]) 1 MG tablet Take 0.5 tablets (0.5 mg total) by mouth 4 (four) times daily as needed for anxiety or sleep. 02/19/15  Yes Wanda Plump, MD  fluticasone [facility]) 50 MCG/ACT nasal spray Place 2 sprays into both nostrils daily. Patient not taking: Reported on 12/05/2014 06/06/14   Waldon Merl, PA-C  GARCINIA CAMBOGIA-CHROMIUM PO Take 1,500 mg by mouth daily.    Historical Provider, MD  ibuprofen (ADVIL,MOTRIN) 200 MG tablet Take 200 mg by mouth every 6 (six) hours as needed (Wrist Pain).    Historical Provider, MD  Misc Natural Products (COLON CLEANSE) CAPS Take 2 each by mouth daily.    Historical Provider, MD  Multiple Vitamin (MULTIVITAMIN) tablet Take 1 tablet by mouth daily. Nature Made  Women's    Historical Provider, MD  omega-3 fish oil (MAXEPA) 1000 MG CAPS capsule Take 1 capsule by mouth daily.    Historical Provider, MD  ondansetron (ZOFRAN) 4 MG tablet Take 1 tablet (4 mg total) by mouth 4 (four) times daily as needed for nausea or vomiting. 02/26/15   Raeford RazorStephen Emillee Talsma, MD   BP 115/54 mmHg  Pulse 60  Temp(Src) 97.7 F (36.5 C) (Oral)  Resp 15  Ht 5\' 6"  (1.676 m)  Wt 175 lb (79.379 kg)  BMI 28.26 kg/m2  SpO2 100%  LMP 03/13/2002 Physical Exam  Constitutional: She appears well-developed and well-nourished. No distress.  HENT:  Head: Normocephalic and atraumatic.  Eyes: Conjunctivae are normal. Right eye exhibits no discharge. Left eye exhibits no discharge.  Neck:  Neck supple.  Cardiovascular: Normal rate, regular rhythm and normal heart sounds.  Exam reveals no gallop and no friction rub.   No murmur heard. Pulmonary/Chest: Effort normal and breath sounds normal. No respiratory distress.  Abdominal: Soft. She exhibits no distension. There is no tenderness.  Musculoskeletal: She exhibits no edema or tenderness.  Neurological: She is alert.  Skin: Skin is warm and dry.  Psychiatric: She has a normal mood and affect. Her behavior is normal. Thought content normal.  Nursing note and vitals reviewed.   ED Course  Procedures (including critical care time) Labs Review Labs Reviewed  CBC - Abnormal; Notable for the following:    WBC 15.4 (*)    Hemoglobin 16.1 (*)    HCT 46.4 (*)    All other components within normal limits  COMPREHENSIVE METABOLIC PANEL - Abnormal; Notable for the following:    CO2 19 (*)    Glucose, Bld 137 (*)    All other components within normal limits  TROPONIN I  LIPASE, BLOOD    Imaging Review No results found. I have personally reviewed and evaluated these images and lab results as part of my medical decision-making.   EKG Interpretation   Date/Time:  Thursday February 26 2015 08:01:25 EST Ventricular Rate:  68 PR Interval:  155 QRS Duration: 100 QT Interval:  429 QTC Calculation: 456 R Axis:   89 Text Interpretation:  Sinus rhythm Non-specific ST-t changes Confirmed by  Juleen ChinaKOHUT  MD, Amarri Satterly ([id number]) on 02/26/2015 8:09:28 AM      MDM   Final diagnoses:  Epigastric pain  Non-intractable vomiting with nausea, vomiting of unspecified type        Raeford RazorStephen Baldwin Racicot, MD 03/12/15 1152

## 2015-04-07 ENCOUNTER — Ambulatory Visit: Payer: BLUE CROSS/BLUE SHIELD | Admitting: Internal Medicine

## 2015-07-06 ENCOUNTER — Telehealth: Payer: Self-pay

## 2015-07-06 MED ORDER — ALPRAZOLAM 1 MG PO TABS: 0.5000 mg | ORAL_TABLET | Freq: Four times a day (QID) | ORAL | Status: DC | PRN

## 2015-07-06 NOTE — Telephone Encounter (Signed)
Rx printed, awaiting MD signature.  

## 2015-07-06 NOTE — Telephone Encounter (Signed)
Pt is requesting refill on Alprazolam.  Last OV: 12/05/2014 Last Fill: 02/19/2015 #60 and 3RF UDS: 12/04/2014 Low risk  Please advise.

## 2015-07-06 NOTE — Telephone Encounter (Signed)
Ok #30, 3 RF

## 2015-07-06 NOTE — Telephone Encounter (Signed)
Rx faxed to [facility] pharmacy.  

## 2015-07-23 ENCOUNTER — Observation Stay (HOSPITAL_BASED_OUTPATIENT_CLINIC_OR_DEPARTMENT_OTHER)
Admission: EM | Admit: 2015-07-23 | Discharge: 2015-07-24 | Disposition: A | Discharge status: Home / Self Care | Payer: BLUE CROSS/BLUE SHIELD | Attending: Surgery | Admitting: Surgery

## 2015-07-23 ENCOUNTER — Encounter (HOSPITAL_COMMUNITY)
Admission: EM | Disposition: A | Discharge status: Home / Self Care | Payer: Self-pay | Source: Home / Self Care | Attending: Emergency Medicine

## 2015-07-23 ENCOUNTER — Observation Stay (HOSPITAL_COMMUNITY): Payer: BLUE CROSS/BLUE SHIELD | Admitting: Anesthesiology

## 2015-07-23 ENCOUNTER — Emergency Department (HOSPITAL_BASED_OUTPATIENT_CLINIC_OR_DEPARTMENT_OTHER): Payer: BLUE CROSS/BLUE SHIELD

## 2015-07-23 ENCOUNTER — Encounter (HOSPITAL_BASED_OUTPATIENT_CLINIC_OR_DEPARTMENT_OTHER): Payer: Self-pay | Admitting: Emergency Medicine

## 2015-07-23 DIAGNOSIS — R52 Pain, unspecified: Secondary | ICD-10-CM

## 2015-07-23 DIAGNOSIS — K353 Acute appendicitis with localized peritonitis: Secondary | ICD-10-CM | POA: Diagnosis not present

## 2015-07-23 DIAGNOSIS — Z87891 Personal history of nicotine dependence: Secondary | ICD-10-CM | POA: Diagnosis not present

## 2015-07-23 DIAGNOSIS — R109 Unspecified abdominal pain: Secondary | ICD-10-CM | POA: Diagnosis present

## 2015-07-23 DIAGNOSIS — F419 Anxiety disorder, unspecified: Secondary | ICD-10-CM | POA: Diagnosis not present

## 2015-07-23 DIAGNOSIS — Z79899 Other long term (current) drug therapy: Secondary | ICD-10-CM | POA: Diagnosis not present

## 2015-07-23 DIAGNOSIS — Z7951 Long term (current) use of inhaled steroids: Secondary | ICD-10-CM | POA: Diagnosis not present

## 2015-07-23 DIAGNOSIS — F329 Major depressive disorder, single episode, unspecified: Secondary | ICD-10-CM | POA: Diagnosis not present

## 2015-07-23 DIAGNOSIS — K3533 Acute appendicitis with perforation and localized peritonitis, with abscess: Secondary | ICD-10-CM | POA: Insufficient documentation

## 2015-07-23 DIAGNOSIS — K37 Unspecified appendicitis: Secondary | ICD-10-CM | POA: Diagnosis present

## 2015-07-23 DIAGNOSIS — Z791 Long term (current) use of non-steroidal anti-inflammatories (NSAID): Secondary | ICD-10-CM | POA: Insufficient documentation

## 2015-07-23 DIAGNOSIS — E669 Obesity, unspecified: Secondary | ICD-10-CM | POA: Diagnosis not present

## 2015-07-23 DIAGNOSIS — D72829 Elevated white blood cell count, unspecified: Secondary | ICD-10-CM | POA: Insufficient documentation

## 2015-07-23 DIAGNOSIS — K3589 Other acute appendicitis without perforation or gangrene: Secondary | ICD-10-CM

## 2015-07-23 HISTORY — PX: LAPAROSCOPIC APPENDECTOMY: SHX408

## 2015-07-23 LAB — URINALYSIS, ROUTINE W REFLEX MICROSCOPIC
BILIRUBIN URINE: NEGATIVE
Glucose, UA: NEGATIVE mg/dL
HGB URINE DIPSTICK: NEGATIVE
KETONES UR: NEGATIVE mg/dL
Leukocytes, UA: NEGATIVE
NITRITE: NEGATIVE
PROTEIN: NEGATIVE mg/dL
SPECIFIC GRAVITY, URINE: 1.017 (ref 1.005–1.030)
pH: 7 (ref 5.0–8.0)

## 2015-07-23 LAB — RAPID URINE DRUG SCREEN, HOSP PERFORMED
Amphetamines: NOT DETECTED
Barbiturates: NOT DETECTED
Benzodiazepines: NOT DETECTED
Cocaine: NOT DETECTED
OPIATES: NOT DETECTED
TETRAHYDROCANNABINOL: POSITIVE — AB

## 2015-07-23 LAB — COMPREHENSIVE METABOLIC PANEL
ALBUMIN: 4.3 g/dL (ref 3.5–5.0)
ALT: 13 U/L — ABNORMAL LOW (ref 14–54)
ANION GAP: 10 (ref 5–15)
AST: 15 U/L (ref 15–41)
Alkaline Phosphatase: 63 U/L (ref 38–126)
BUN: <5 mg/dL — ABNORMAL LOW (ref 6–20)
CHLORIDE: 105 mmol/L (ref 101–111)
CO2: 23 mmol/L (ref 22–32)
Calcium: 9.5 mg/dL (ref 8.9–10.3)
Creatinine, Ser: 0.52 mg/dL (ref 0.44–1.00)
GFR calc non Af Amer: >60 mL/min (ref >60)
GLUCOSE: 117 mg/dL — AB (ref 65–99)
POTASSIUM: 3.6 mmol/L (ref 3.5–5.1)
SODIUM: 138 mmol/L (ref 135–145)
Total Bilirubin: 0.7 mg/dL (ref 0.3–1.2)
Total Protein: 7.3 g/dL (ref 6.5–8.1)

## 2015-07-23 LAB — CBC WITH DIFFERENTIAL/PLATELET
BASOS PCT: 0 %
Basophils Absolute: 0 10*3/uL (ref 0.0–0.1)
EOS ABS: 0.1 10*3/uL (ref 0.0–0.7)
EOS PCT: 1 %
HCT: 42.6 % (ref 36.0–46.0)
Hemoglobin: 14.5 g/dL (ref 12.0–15.0)
LYMPHS PCT: 19 %
Lymphs Abs: 3 10*3/uL (ref 0.7–4.0)
MCH: 31.7 pg (ref 26.0–34.0)
MCHC: 34 g/dL (ref 30.0–36.0)
MCV: 93 fL (ref 78.0–100.0)
MONO ABS: 0.8 10*3/uL (ref 0.1–1.0)
MONOS PCT: 5 %
NEUTROS ABS: 12.1 10*3/uL — AB (ref 1.7–7.7)
NEUTROS PCT: 75 %
PLATELETS: 207 10*3/uL (ref 150–400)
RBC: 4.58 MIL/uL (ref 3.87–5.11)
RDW: 12.5 % (ref 11.5–15.5)
WBC: 16 10*3/uL — ABNORMAL HIGH (ref 4.0–10.5)

## 2015-07-23 LAB — SURGICAL PCR SCREEN
MRSA, PCR: NEGATIVE
Staphylococcus aureus: NEGATIVE

## 2015-07-23 SURGERY — APPENDECTOMY, LAPAROSCOPIC
Anesthesia: General

## 2015-07-23 MED ORDER — FENTANYL CITRATE (PF) 100 MCG/2ML IJ SOLN
INTRAMUSCULAR | Status: DC | PRN
  Administered 2015-07-23: 100 ug via INTRAVENOUS
  Administered 2015-07-23 (×3): 50 ug via INTRAVENOUS
  Administered 2015-07-23: 75 ug via INTRAVENOUS
  Administered 2015-07-23 (×2): 50 ug via INTRAVENOUS
  Administered 2015-07-23: 25 ug via INTRAVENOUS
  Administered 2015-07-23: 50 ug via INTRAVENOUS

## 2015-07-23 MED ORDER — DIPHENHYDRAMINE HCL 25 MG PO CAPS: 25.0000 mg | ORAL_CAPSULE | Freq: Four times a day (QID) | ORAL | Status: DC | PRN

## 2015-07-23 MED ORDER — DICYCLOMINE HCL 10 MG/ML IM SOLN
20.0000 mg | Freq: Once | INTRAMUSCULAR | Status: AC
  Administered 2015-07-23: 20 mg via INTRAMUSCULAR
  Filled 2015-07-23: qty 2

## 2015-07-23 MED ORDER — ACETAMINOPHEN 325 MG PO TABS: 650.0000 mg | ORAL_TABLET | Freq: Four times a day (QID) | ORAL | Status: DC | PRN

## 2015-07-23 MED ORDER — ACETAMINOPHEN 650 MG RE SUPP: 650.0000 mg | Freq: Four times a day (QID) | RECTAL | Status: DC | PRN

## 2015-07-23 MED ORDER — PROPOFOL 10 MG/ML IV BOLUS
INTRAVENOUS | Status: AC
  Filled 2015-07-23: qty 20

## 2015-07-23 MED ORDER — ONDANSETRON HCL 4 MG/2ML IJ SOLN
4.0000 mg | Freq: Once | INTRAMUSCULAR | Status: AC
  Administered 2015-07-23: 4 mg via INTRAVENOUS
  Filled 2015-07-23: qty 2

## 2015-07-23 MED ORDER — LACTATED RINGERS IR SOLN
Status: DC | PRN
  Administered 2015-07-23: 1000 mL

## 2015-07-23 MED ORDER — FENTANYL CITRATE (PF) 250 MCG/5ML IJ SOLN
INTRAMUSCULAR | Status: AC
  Filled 2015-07-23: qty 5

## 2015-07-23 MED ORDER — SODIUM CHLORIDE 0.9 % IV BOLUS (SEPSIS)
1000.0000 mL | Freq: Once | INTRAVENOUS | Status: AC
  Administered 2015-07-23: 1000 mL via INTRAVENOUS

## 2015-07-23 MED ORDER — ONDANSETRON 4 MG PO TBDP: 4.0000 mg | ORAL_TABLET | Freq: Four times a day (QID) | ORAL | Status: DC | PRN

## 2015-07-23 MED ORDER — FENTANYL CITRATE (PF) 100 MCG/2ML IJ SOLN
50.0000 ug | Freq: Once | INTRAMUSCULAR | Status: AC
  Administered 2015-07-23: 50 ug via INTRAVENOUS
  Filled 2015-07-23: qty 2

## 2015-07-23 MED ORDER — METHOCARBAMOL 500 MG PO TABS
500.0000 mg | ORAL_TABLET | Freq: Four times a day (QID) | ORAL | Status: DC | PRN
  Administered 2015-07-24 (×2): 500 mg via ORAL
  Filled 2015-07-23 (×2): qty 1

## 2015-07-23 MED ORDER — ONDANSETRON HCL 4 MG/2ML IJ SOLN
INTRAMUSCULAR | Status: DC | PRN
  Administered 2015-07-23: 4 mg via INTRAVENOUS

## 2015-07-23 MED ORDER — PROPOFOL 10 MG/ML IV BOLUS
INTRAVENOUS | Status: DC | PRN
  Administered 2015-07-23: 200 mg via INTRAVENOUS

## 2015-07-23 MED ORDER — ONDANSETRON HCL 4 MG/2ML IJ SOLN
4.0000 mg | Freq: Four times a day (QID) | INTRAMUSCULAR | Status: DC | PRN
  Administered 2015-07-24: 4 mg via INTRAVENOUS
  Filled 2015-07-23: qty 2

## 2015-07-23 MED ORDER — DEXTROSE 5 % IV SOLN
INTRAVENOUS | Status: AC
  Filled 2015-07-23: qty 2

## 2015-07-23 MED ORDER — MORPHINE SULFATE (PF) 2 MG/ML IV SOLN
1.0000 mg | INTRAVENOUS | Status: DC | PRN
Start: 2015-07-23 — End: 2015-07-24
  Administered 2015-07-23 – 2015-07-24 (×7): 2 mg via INTRAVENOUS
  Filled 2015-07-23 (×7): qty 1

## 2015-07-23 MED ORDER — DEXAMETHASONE SODIUM PHOSPHATE 10 MG/ML IJ SOLN
INTRAMUSCULAR | Status: DC | PRN
  Administered 2015-07-23: 10 mg via INTRAVENOUS

## 2015-07-23 MED ORDER — SODIUM CHLORIDE 0.9 % IV SOLN
Freq: Once | INTRAVENOUS | Status: AC
  Administered 2015-07-23: 04:00:00 via INTRAVENOUS

## 2015-07-23 MED ORDER — MORPHINE SULFATE (PF) 4 MG/ML IV SOLN
4.0000 mg | Freq: Once | INTRAVENOUS | Status: AC
  Administered 2015-07-23: 4 mg via INTRAVENOUS
  Filled 2015-07-23: qty 1

## 2015-07-23 MED ORDER — FENTANYL CITRATE (PF) 100 MCG/2ML IJ SOLN
100.0000 ug | Freq: Once | INTRAMUSCULAR | Status: AC
  Administered 2015-07-23: 100 ug via INTRAVENOUS
  Filled 2015-07-23: qty 2

## 2015-07-23 MED ORDER — LIDOCAINE HCL (CARDIAC) 20 MG/ML IV SOLN
INTRAVENOUS | Status: DC | PRN
  Administered 2015-07-23: 100 mg via INTRAVENOUS

## 2015-07-23 MED ORDER — DEXTROSE 5 % IV SOLN
2.0000 g | Freq: Once | INTRAVENOUS | Status: AC
  Administered 2015-07-23: 2 g via INTRAVENOUS
  Filled 2015-07-23: qty 2

## 2015-07-23 MED ORDER — BUPIVACAINE-EPINEPHRINE 0.25% -1:200000 IJ SOLN
INTRAMUSCULAR | Status: AC
  Filled 2015-07-23: qty 2

## 2015-07-23 MED ORDER — SUCCINYLCHOLINE CHLORIDE 20 MG/ML IJ SOLN
INTRAMUSCULAR | Status: DC | PRN
Start: 2015-07-23 — End: 2015-07-23
  Administered 2015-07-23: 100 mg via INTRAVENOUS

## 2015-07-23 MED ORDER — METRONIDAZOLE IN NACL 5-0.79 MG/ML-% IV SOLN
INTRAVENOUS | Status: AC
  Filled 2015-07-23: qty 100

## 2015-07-23 MED ORDER — BUPIVACAINE-EPINEPHRINE 0.25% -1:200000 IJ SOLN
INTRAMUSCULAR | Status: DC | PRN
  Administered 2015-07-23: 50 mL

## 2015-07-23 MED ORDER — ONDANSETRON HCL 4 MG/2ML IJ SOLN: 4.0000 mg | Freq: Once | INTRAMUSCULAR | Status: DC

## 2015-07-23 MED ORDER — ROCURONIUM BROMIDE 100 MG/10ML IV SOLN
INTRAVENOUS | Status: DC | PRN
  Administered 2015-07-23 (×2): 20 mg via INTRAVENOUS

## 2015-07-23 MED ORDER — HYDROCODONE-ACETAMINOPHEN 5-325 MG PO TABS
1.0000 | ORAL_TABLET | ORAL | Status: DC | PRN
  Administered 2015-07-23 – 2015-07-24 (×3): 2 via ORAL
  Filled 2015-07-23 (×3): qty 2

## 2015-07-23 MED ORDER — DIPHENHYDRAMINE HCL 50 MG/ML IJ SOLN: 25.0000 mg | Freq: Four times a day (QID) | INTRAMUSCULAR | Status: DC | PRN

## 2015-07-23 MED ORDER — POTASSIUM CHLORIDE IN NACL 20-0.9 MEQ/L-% IV SOLN
INTRAVENOUS | Status: DC
  Administered 2015-07-23: 1000 mL via INTRAVENOUS
  Administered 2015-07-23: 100 mL/h via INTRAVENOUS
  Administered 2015-07-24: 1000 mL via INTRAVENOUS
  Filled 2015-07-23 (×4): qty 1000

## 2015-07-23 MED ORDER — KETOROLAC TROMETHAMINE 30 MG/ML IJ SOLN
INTRAMUSCULAR | Status: DC | PRN
  Administered 2015-07-23: 30 mg via INTRAVENOUS

## 2015-07-23 MED ORDER — CIPROFLOXACIN IN D5W 400 MG/200ML IV SOLN
INTRAVENOUS | Status: AC
  Filled 2015-07-23: qty 200

## 2015-07-23 MED ORDER — KETOROLAC TROMETHAMINE 30 MG/ML IJ SOLN
30.0000 mg | Freq: Once | INTRAMUSCULAR | Status: AC
  Administered 2015-07-23: 30 mg via INTRAVENOUS
  Filled 2015-07-23: qty 1

## 2015-07-23 MED ORDER — SUGAMMADEX SODIUM 200 MG/2ML IV SOLN
INTRAVENOUS | Status: DC | PRN
  Administered 2015-07-23: 200 mg via INTRAVENOUS

## 2015-07-23 MED ORDER — MIDAZOLAM HCL 2 MG/2ML IJ SOLN
INTRAMUSCULAR | Status: AC
  Filled 2015-07-23: qty 2

## 2015-07-23 MED ORDER — HYDRALAZINE HCL 20 MG/ML IJ SOLN: 10.0000 mg | INTRAMUSCULAR | Status: DC | PRN

## 2015-07-23 MED ORDER — SODIUM CHLORIDE 0.9 % IV SOLN: INTRAVENOUS | Status: DC

## 2015-07-23 MED ORDER — LACTATED RINGERS IV SOLN
INTRAVENOUS | Status: DC | PRN
  Administered 2015-07-23 (×2): via INTRAVENOUS

## 2015-07-23 MED ORDER — GI COCKTAIL ~~LOC~~
30.0000 mL | Freq: Once | ORAL | Status: AC
  Administered 2015-07-23: 30 mL via ORAL
  Filled 2015-07-23: qty 30

## 2015-07-23 MED ORDER — METRONIDAZOLE IN NACL 5-0.79 MG/ML-% IV SOLN
500.0000 mg | Freq: Once | INTRAVENOUS | Status: AC
  Administered 2015-07-23: 500 mg via INTRAVENOUS
  Filled 2015-07-23: qty 100

## 2015-07-23 MED ORDER — EPHEDRINE SULFATE 50 MG/ML IJ SOLN
INTRAMUSCULAR | Status: DC | PRN
  Administered 2015-07-23: 5 mg via INTRAVENOUS
  Administered 2015-07-23: 10 mg via INTRAVENOUS
  Administered 2015-07-23 (×2): 5 mg via INTRAVENOUS

## 2015-07-23 MED ORDER — FENTANYL CITRATE (PF) 100 MCG/2ML IJ SOLN
25.0000 ug | INTRAMUSCULAR | Status: DC | PRN
  Administered 2015-07-23 (×2): 50 ug via INTRAVENOUS

## 2015-07-23 MED ORDER — CEFTRIAXONE SODIUM 1 G IJ SOLR
1.0000 g | INTRAMUSCULAR | Status: DC | PRN
  Administered 2015-07-23: 2 g via INTRAVENOUS

## 2015-07-23 MED ORDER — MEPERIDINE HCL 50 MG/ML IJ SOLN: 6.2500 mg | INTRAMUSCULAR | Status: DC | PRN

## 2015-07-23 MED ORDER — FENTANYL CITRATE (PF) 100 MCG/2ML IJ SOLN
INTRAMUSCULAR | Status: AC
  Filled 2015-07-23: qty 2

## 2015-07-23 MED ORDER — MIDAZOLAM HCL 5 MG/5ML IJ SOLN
INTRAMUSCULAR | Status: DC | PRN
  Administered 2015-07-23: 2 mg via INTRAVENOUS

## 2015-07-23 SURGICAL SUPPLY — 42 items
APPLIER CLIP 5 13 M/L LIGAMAX5 (MISCELLANEOUS)
APPLIER CLIP ROT 10 11.4 M/L (STAPLE)
CABLE HIGH FREQUENCY MONO STRZ (ELECTRODE) ×3 IMPLANT
CHLORAPREP W/TINT 26ML (MISCELLANEOUS) ×3 IMPLANT
CLIP APPLIE 5 13 M/L LIGAMAX5 (MISCELLANEOUS) IMPLANT
CLIP APPLIE ROT 10 11.4 M/L (STAPLE) IMPLANT
COVER SURGICAL LIGHT HANDLE (MISCELLANEOUS) ×3 IMPLANT
CUTTER FLEX LINEAR 45M (STAPLE) ×3 IMPLANT
DECANTER SPIKE VIAL GLASS SM (MISCELLANEOUS) IMPLANT
DEVICE TROCAR PUNCTURE CLOSURE (ENDOMECHANICALS) ×3 IMPLANT
DRAPE LAPAROSCOPIC ABDOMINAL (DRAPES) ×3 IMPLANT
DRAPE WARM FLUID 44X44 (DRAPE) IMPLANT
DRSG TEGADERM 2-3/8X2-3/4 SM (GAUZE/BANDAGES/DRESSINGS) ×6 IMPLANT
DRSG TEGADERM 4X4.75 (GAUZE/BANDAGES/DRESSINGS) ×3 IMPLANT
ELECT REM PT RETURN 9FT ADLT (ELECTROSURGICAL) ×3
ELECTRODE REM PT RTRN 9FT ADLT (ELECTROSURGICAL) ×1 IMPLANT
ENDOLOOP SUT PDS II  0 18 (SUTURE)
ENDOLOOP SUT PDS II 0 18 (SUTURE) IMPLANT
GAUZE SPONGE 2X2 8PLY STRL LF (GAUZE/BANDAGES/DRESSINGS) ×1 IMPLANT
GLOVE ECLIPSE 8.0 STRL XLNG CF (GLOVE) ×9 IMPLANT
GLOVE INDICATOR 8.0 STRL GRN (GLOVE) ×3 IMPLANT
GOWN STRL REUS W/TWL XL LVL3 (GOWN DISPOSABLE) ×9 IMPLANT
KIT BASIN OR (CUSTOM PROCEDURE TRAY) ×3 IMPLANT
PAD POSITIONING PINK XL (MISCELLANEOUS) ×3 IMPLANT
POUCH SPECIMEN RETRIEVAL 10MM (ENDOMECHANICALS) ×3 IMPLANT
RELOAD 45 VASCULAR/THIN (ENDOMECHANICALS) IMPLANT
RELOAD STAPLE TA45 3.5 REG BLU (ENDOMECHANICALS) ×3 IMPLANT
SCISSORS LAP 5X35 DISP (ENDOMECHANICALS) ×3 IMPLANT
SET IRRIG TUBING LAPAROSCOPIC (IRRIGATION / IRRIGATOR) ×3 IMPLANT
SHEARS HARMONIC ACE PLUS 36CM (ENDOMECHANICALS) IMPLANT
SLEEVE XCEL OPT CAN 5 100 (ENDOMECHANICALS) ×3 IMPLANT
SPONGE GAUZE 2X2 STER 10/PKG (GAUZE/BANDAGES/DRESSINGS) ×2
SUT MNCRL AB 4-0 PS2 18 (SUTURE) ×3 IMPLANT
SUT PDS AB 0 CT1 36 (SUTURE) IMPLANT
SUT SILK 2 0 SH (SUTURE) IMPLANT
TOWEL OR 17X26 10 PK STRL BLUE (TOWEL DISPOSABLE) ×3 IMPLANT
TRAY FOLEY W/METER SILVER 14FR (SET/KITS/TRAYS/PACK) ×3 IMPLANT
TRAY FOLEY W/METER SILVER 16FR (SET/KITS/TRAYS/PACK) IMPLANT
TRAY LAPAROSCOPIC (CUSTOM PROCEDURE TRAY) ×3 IMPLANT
TROCAR BLADELESS OPT 5 100 (ENDOMECHANICALS) ×3 IMPLANT
TROCAR XCEL 12X100 BLDLESS (ENDOMECHANICALS) ×3 IMPLANT
TUBING INSUF HEATED (TUBING) ×3 IMPLANT

## 2015-07-23 NOTE — Anesthesia Preprocedure Evaluation (Addendum)
Anesthesia Evaluation  Patient identified by MRN, date of birth, ID band Patient awake    Reviewed: Allergy & Precautions, NPO status , Patient's Chart, lab work & pertinent test results  Airway Mallampati: II   Neck ROM: Full    Dental  (+) Edentulous Upper, Edentulous Lower   Pulmonary neg pulmonary ROS, former smoker (quit 2016),    breath sounds clear to auscultation       Cardiovascular negative cardio ROS   Rhythm:Regular     Neuro/Psych Anxiety Depression negative neurological ROS  negative psych ROS   GI/Hepatic negative GI ROS, Neg liver ROS,   Endo/Other  negative endocrine ROS  Renal/GU negative Renal ROS  negative genitourinary   Musculoskeletal negative musculoskeletal ROS (+)   Abdominal (+)  Abdomen: soft.    Peds negative pediatric ROS (+)  Hematology negative hematology ROS (+)   Anesthesia Other Findings   Reproductive/Obstetrics negative OB ROS                            Anesthesia Physical Anesthesia Plan  ASA: I  Anesthesia Plan: General   Post-op Pain Management:    Induction: Intravenous  Airway Management Planned: Oral ETT  Additional Equipment:   Intra-op Plan:   Post-operative Plan: Extubation in OR  Informed Consent: I have reviewed the patients History and Physical, chart, labs and discussed the procedure including the risks, benefits and alternatives for the proposed anesthesia with the patient or authorized representative who has indicated his/her understanding and acceptance.     Plan Discussed with:   Anesthesia Plan Comments:         Anesthesia Quick Evaluation

## 2015-07-23 NOTE — ED Provider Notes (Signed)
Kylie Munoz is an 40 y.o. female [facility]. She was originally evaluated by Dr. April Palumbo for one day of abdominal pain and associated N/V/D. Labs revealed a white count of 16.0, UDS positive for THC. CT renal study was obtained and revealed uncomplicated acute appendicitis. Pt was transferred here for evaluation by [facility]/Dr. Dwain SarnaWakefield. She has received fluids, pain meds, anti-emetics, and a dose of rocephin and flagyl. Pt states now in the [facility] her pain has returned as has her nausea, but denies emesis.    Physical Exam  BP 98/59 mmHg  Pulse 61  Temp(Src) 97.9 F (36.6 C) (Oral)  Resp 20  SpO2 96%  LMP 03/13/2002  Physical Exam  Constitutional: She is oriented to person, place, and time. No distress.  Appears uncomfortable but NAD  HENT:  Head: Atraumatic.  Right Ear: External ear normal.  Left Ear: External ear normal.  Nose: Nose normal.  Eyes: Conjunctivae are normal. No scleral icterus.  Neck: Normal range of motion. Neck supple.  Cardiovascular: Normal rate and regular rhythm.   Pulmonary/Chest: Effort normal. No respiratory distress. She exhibits no tenderness.  Abdominal: Soft. She exhibits no distension. There is generalized tenderness.  Neurological: She is alert and oriented to person, place, and time.  Skin: Skin is warm and dry. She is not diaphoretic. There is pallor.  Psychiatric: She has a normal mood and affect. Her behavior is normal.  Nursing note and vitals reviewed.   ED Course  Procedures  MDM Dr. Dwain SarnaWakefield has been notified of pt's arrival in the ED. Will give dose of pain meds and anti-emetics. Pt has been given abx, fluids, and is NPO.   [facility] has seen and evaluated patient with plans to OR today for lap appy. Appreciate assistance.  Kylie CoriaSerena Y Quinto Tippy, PA-C 07/23/15 0740  Derwood KaplanAnkit Nanavati, MD 07/24/15 [phone]

## 2015-07-23 NOTE — Anesthesia Procedure Notes (Addendum)
Procedure Name: Intubation Date/Time: 07/23/2015 2:41 PM Performed by: Minerva EndsMIRARCHI, Antjuan Rothe M Pre-anesthesia Checklist: Patient identified, Emergency Drugs available, Suction available and Patient being monitored Patient Re-evaluated:Patient Re-evaluated prior to inductionOxygen Delivery Method: Circle System Utilized Preoxygenation: Pre-oxygenation with 100% oxygen Intubation Type: IV induction Ventilation: Mask ventilation without difficulty Laryngoscope Size: Mac and 3 Grade View: Grade I Tube type: Oral Tube size: 7.5 mm Number of attempts: 1 Airway Equipment and Method: Stylet and Oral airway Placement Confirmation: ETT inserted through vocal cords under direct vision,  positive ETCO2 and breath sounds checked- equal and bilateral Secured at: 21 cm Tube secured with: Tape Dental Injury: Teeth and Oropharynx as per pre-operative assessment  Comments: By Armen PickupBrad Coomer SRNA- supervised by [facility] Center[facility]ney MDA

## 2015-07-23 NOTE — Consult Note (Signed)
[facility] Admission Note  Kylie Munoz [birth year]  [id number].    Requesting MD: Dr. April Palumbo Chief Complaint/Reason for Consult: appendicitis  HPI:  40 y/o female tobacco user presenting to [facility] as a transfer from [facility] for acute appendicitis. Daughter and son in the room with her. Pt states she began having pain in her upper abdomen around 4:00 am on 5/24. Later that morning she began vomiting and "was vomiting until 3:00am the next day". Describes her pain as sharp and "like someone is twisting my insides really hard" and moving from her upper abdomen down to her lower abdomen/pelvis. Pain is worse with movement and deep inspiration and lessened with IV pain medication.   She denies dizziness, HA, hematemeses, CP, dysuria, hematuria, hematochezia.  Denies a PMH of HTN, DM, CVA, MI Denies use of blood thinners. Past surgical history significant for a c-section in 2001 and an abdominal hysterectomy in 2004.  ROS: All systems reviewed and otherwise negative except for as above  Family History  Problem Relation Age of Onset  . Hypertension Mother   . Diabetes Mother   . Heart failure Mother   . Breast cancer Mother     2006 bilat mast; dx in late 30s and again in late 59s  . Lung cancer Mother   . Hypertension Father   . Diabetes Father   . Heart failure Father   . Breast cancer Maternal Grandmother     bilateral cancer; diagnosed in mid 28s and again in her 35s  . Hypertension Maternal Grandmother   . Diabetes Maternal Grandmother   . Hypertension Maternal Grandfather   . Breast cancer Paternal Grandmother 82  . Stroke Paternal Grandmother   . Hypertension Paternal Grandmother   . Diabetes Paternal Grandmother   . Heart failure Paternal Grandmother   . Hypertension Paternal Grandfather   . Ovarian cancer Paternal Aunt     paternal grandmother's sister  . Ovarian cancer Maternal Aunt     maternal grandmother's sister    Past Medical History   Diagnosis Date  . Anxiety and depression   . Obesity   . Anxiety     Past Surgical History  Procedure Laterality Date  . Abdominal hysterectomy  2004    has one ovary  . Cesarean section  2001  . Dental surgery  2006  . Carpal tunnel release Right 2009    Social History:  reports that she quit smoking about 5 months ago. Her smoking use included Cigarettes. She smoked 0.50 packs per day. She has never used smokeless tobacco. She reports that she drinks alcohol. She reports that she does not use illicit drugs.  Allergies:  Allergies  Allergen Reactions  . Iohexol      Code: HIVES, Desc: PT had a near syncope event,developed hives & difficulty swallowing post injection of 125 ml Omni 300.Treated by Dr.McAlhany with 50 mg PO, 23m IV benedryl, 125 mg solu-cortef, 0.3 mg Epinephrine IM, 40 mg famotidine PO. [facility], Onset Date: [id number]  . Tramadol Hcl     REACTION: itching     (Not in a hospital admission)  Blood pressure 98/59, pulse 61, temperature 97.9 F (36.6 C), temperature source Oral, resp. rate 20, last menstrual period 03/13/2002, SpO2 96 %. Physical Exam: General: pleasant, WD/WN white female who is laying in bed in mild distress due to abdominal pain. HEENT: head is normocephalic, atraumatic.  Sclera are noninjected. Mouth is dry. Heart: regular, rate, and rhythm.  No obvious murmurs, gallops, or rubs noted.  Palpable pedal pulses bilaterally Lungs: CTAB, no wheezes, rhonchi, or rales noted.  Respiratory effort mildly labored. Abd: soft, ND, +BS, no masses, hernias, or organomegaly. Very TTP, tenderness at McBurney's point. MS: all 4 extremities are symmetrical with no cyanosis, clubbing, or edema. Skin: warm and dry Psych: A&Ox3 with an appropriate affect. Neuro: CM 2-12 intact, extremity CSM intact bilaterally, normal speech  Results for orders placed or performed during the hospital encounter of 07/23/15 (from the past 48 hour(s))   Comprehensive metabolic panel     Status: Abnormal   Collection Time: 07/23/15  4:25 AM  Result Value Ref Range   Sodium 138 135 - 145 mmol/L   Potassium 3.6 3.5 - 5.1 mmol/L   Chloride 105 101 - 111 mmol/L   CO2 23 22 - 32 mmol/L   Glucose, Bld 117 (H) 65 - 99 mg/dL   BUN <5 (L) 6 - 20 mg/dL    Comment: REPEATED TO VERIFY   Creatinine, Ser 0.52 0.44 - 1.00 mg/dL   Calcium 9.5 8.9 - 10.3 mg/dL   Total Protein 7.3 6.5 - 8.1 g/dL   Albumin 4.3 3.5 - 5.0 g/dL   AST 15 15 - 41 U/L   ALT 13 (L) 14 - 54 U/L   Alkaline Phosphatase 63 38 - 126 U/L   Total Bilirubin 0.7 0.3 - 1.2 mg/dL   GFR calc non Af Amer >60 >60 mL/min   GFR calc Af Amer >60 >60 mL/min    Comment: (NOTE) The eGFR has been calculated using the CKD EPI equation. This calculation has not been validated in all clinical situations. eGFR's persistently <60 mL/min signify possible Chronic Kidney Disease.    Anion gap 10 5 - 15  CBC with Differential/Platelet     Status: Abnormal   Collection Time: 07/23/15  4:25 AM  Result Value Ref Range   WBC 16.0 (H) 4.0 - 10.5 K/uL   RBC 4.58 3.87 - 5.11 MIL/uL   Hemoglobin 14.5 12.0 - 15.0 g/dL   HCT 42.6 36.0 - 46.0 %   MCV 93.0 78.0 - 100.0 fL   MCH 31.7 26.0 - 34.0 pg   MCHC 34.0 30.0 - 36.0 g/dL   RDW 12.5 11.5 - 15.5 %   Platelets 207 150 - 400 K/uL   Neutrophils Relative % 75 %   Neutro Abs 12.1 (H) 1.7 - 7.7 K/uL   Lymphocytes Relative 19 %   Lymphs Abs 3.0 0.7 - 4.0 K/uL   Monocytes Relative 5 %   Monocytes Absolute 0.8 0.1 - 1.0 K/uL   Eosinophils Relative 1 %   Eosinophils Absolute 0.1 0.0 - 0.7 K/uL   Basophils Relative 0 %   Basophils Absolute 0.0 0.0 - 0.1 K/uL  Urinalysis, Routine w reflex microscopic (not at [facility])     Status: Abnormal   Collection Time: 07/23/15  4:55 AM  Result Value Ref Range   Color, Urine AMBER (A) YELLOW    Comment: BIOCHEMICALS MAY BE AFFECTED BY COLOR   APPearance CLOUDY (A) CLEAR   Specific Gravity, Urine 1.017 1.005 - 1.030    pH 7.0 5.0 - 8.0   Glucose, UA NEGATIVE NEGATIVE mg/dL   Hgb urine dipstick NEGATIVE NEGATIVE   Bilirubin Urine NEGATIVE NEGATIVE   Ketones, ur NEGATIVE NEGATIVE mg/dL   Protein, ur NEGATIVE NEGATIVE mg/dL   Nitrite NEGATIVE NEGATIVE   Leukocytes, UA NEGATIVE NEGATIVE    Comment: MICROSCOPIC NOT DONE ON URINES WITH NEGATIVE  PROTEIN, BLOOD, LEUKOCYTES, NITRITE, OR GLUCOSE <1000 mg/dL.  Urine rapid drug screen (hosp performed)     Status: Abnormal   Collection Time: 07/23/15  4:55 AM  Result Value Ref Range   Opiates NONE DETECTED NONE DETECTED   Cocaine NONE DETECTED NONE DETECTED   Benzodiazepines NONE DETECTED NONE DETECTED   Amphetamines NONE DETECTED NONE DETECTED   Tetrahydrocannabinol POSITIVE (A) NONE DETECTED   Barbiturates NONE DETECTED NONE DETECTED    Comment:        DRUG SCREEN FOR MEDICAL PURPOSES ONLY.  IF CONFIRMATION IS NEEDED FOR ANY PURPOSE, NOTIFY LAB WITHIN 5 DAYS.        LOWEST DETECTABLE LIMITS FOR URINE DRUG SCREEN Drug Class       Cutoff (ng/mL) Amphetamine      1000 Barbiturate      200 Benzodiazepine   732 Tricyclics       202 Opiates          300 Cocaine          300 THC              50    Ct Renal Stone Study  07/23/2015  CLINICAL DATA:  Right lower quadrant right flank pain for 1 day. EXAM: CT ABDOMEN AND PELVIS WITHOUT CONTRAST TECHNIQUE: Multidetector CT imaging of the abdomen and pelvis was performed following the standard protocol without IV contrast. COMPARISON:  CT 06/03/2008 FINDINGS: Lower chest: The included lung bases are clear. Small hiatal hernia. Liver: No focal lesion allowing for lack contrast. Hepatobiliary: Gallbladder physiologically distended, no calcified stone. No biliary dilatation. Pancreas: No ductal dilatation or inflammation. Spleen: Normal. Adrenal glands: No nodule. Kidneys: Symmetric in size without stones or hydronephrosis. There is no perinephric stranding. Both ureters are decompressed without stones along the course.  Stomach/Bowel: Stomach physiologically distended. There are no dilated or thickened small bowel loops. Colonic diverticulosis from the descending through the sigmoid colon without acute diverticulitis. This is extensive for age in the sigmoid colon. Small volume of stool. Appendix: Dilated measuring 12 mm, fluid-filled, with surrounding periappendiceal fat stranding. No definite appendicolith. No perforation or abscess. Vascular/Lymphatic: Small retroperitoneal lymph nodes, not enlarged by size criteria. Abdominal aorta is normal in caliber. Mild atherosclerosis without aneurysm. Reproductive: The uterus is surgically absent. Left ovary is normal. Post right oophorectomy per report. Bladder: Minimally distended, equivocal wall thickening. Other: No free air, free fluid, or intra-abdominal fluid collection. Musculoskeletal: There are no acute or suspicious osseous abnormalities. Facet arthropathy in the lumbar spine. IMPRESSION: 1. Uncomplicated acute appendicitis. 2. Descending and sigmoid colonic diverticulosis, advanced given patient age. No diverticulitis. Electronically Signed   By: Jeb Levering M.D.   On: 07/23/2015 04:51   U/S 07/23/15: Appendix: Dilated measuring 12 mm, fluid-filled, with surrounding periappendiceal fat stranding. No definite appendicolith. No perforation or abscess.   Assessment/Plan Acute Appendicitis Leukocytosis 1.  Admit to Med/Surg  2.  NPO, bowel rest, IVF, pain control, antiemetics, antibiotics (2 g Ceftriaxone, 500 mg Flagyl once) 3.  SCD's or DVT proph 4. Laparoscopic Appendectomy today with Dr. Aleen Sells, [facility] 07/23/2015, 7:28 AM Pager: [phone] Mon-Fri 7:00 am-4:30 pm Sat-Sun 7:00 am-11:30 am

## 2015-07-23 NOTE — ED Notes (Signed)
Bed: WA17 Expected date:  Expected time:  Means of arrival:  Comments: [facility]acute [facility]y

## 2015-07-23 NOTE — ED Notes (Signed)
Surgical PA at bedside  

## 2015-07-23 NOTE — ED Notes (Signed)
Pt being loaded in [facility] at this time condition stable pain much improved

## 2015-07-23 NOTE — Transfer of Care (Signed)
Immediate Anesthesia Transfer of Care Note  Patient: Kylie Munoz  Procedure(s) Performed: Procedure(s): APPENDECTOMY LAPAROSCOPIC (N/A)  Patient Location: PACU  Anesthesia Type:General  Level of Consciousness: awake and alert   Airway & Oxygen Therapy: Patient Spontanous Breathing and Patient connected to nasal cannula oxygen  Post-op Assessment: Report given to RN and Post -op Vital signs reviewed and stable  Post vital signs: Reviewed and stable  Last Vitals:  Filed Vitals:   07/23/15 1049 07/23/15 1605  BP: 140/70   Pulse: 60   Temp: 36.6 C 36.7 C  Resp: 18     Last Pain:  Filed Vitals:   07/23/15 1606  PainSc: 10-Worst pain ever      Patients Stated Pain Goal: 4 (07/23/15 1242)  Complications: No apparent anesthesia complications

## 2015-07-23 NOTE — Op Note (Signed)
3:57 PM  PATIENT:  Huel CoteMary C Smejkal  40 y.o. female  Patient Care Team: Wanda PlumpJose E Paz, MD as PCP - General  PRE-OPERATIVE DIAGNOSIS:  Acute appendicitis  POST-OPERATIVE DIAGNOSIS:  Acute appendicitis  PROCEDURE:  Procedure(s): APPENDECTOMY LAPAROSCOPIC  SURGEON:  Surgeon(s): Karie SodaSteven Thekla Colborn, MD  ASSIST:  Jaynie BreamJoshua Rickey, MD  ANESTHESIA:   local and general  EBL:  Total I/O In: -  Out: 375 [Urine:350; Blood:25]  Delay start of Pharmacological VTE agent (>24hrs) due to surgical blood loss or risk of bleeding:  no  DRAINS: none   SPECIMEN:  Source of Specimen:   APPENDIX  DISPOSITION OF SPECIMEN:  PATHOLOGY  COUNTS:  YES  PLAN OF CARE: Admit for overnight observation  PATIENT DISPOSITION:  PACU - hemodynamically stable.   INDICATIONS: Patient with concerning symptoms & work up suspicious for appendicitis.  Surgery was recommended:  The anatomy & physiology of the digestive tract was discussed.  The pathophysiology of appendicitis was discussed.  Natural history risks without surgery was discussed.   I feel the risks of no intervention will lead to serious problems that outweigh the operative risks; therefore, I recommended diagnostic laparoscopy with removal of appendix to remove the pathology.  Laparoscopic & open techniques were discussed.   I noted a good likelihood this will help address the problem.    Risks such as bleeding, infection, abscess, leak, reoperation, possible ostomy, hernia, heart attack, death, and other risks were discussed.  Goals of post-operative recovery were discussed as well.  We will work to minimize complications.  Questions were answered.  The patient expresses understanding & wishes to proceed with surgery.  OR FINDINGS: Inflamed appendix towards the scarring of the right adnexa.  Somewhat retroperitoneal.  No evidence of any adhesions and other abnormalities.  Status post right salpingo-oophorectomy and abdominal hysterectomy  DESCRIPTION:   The  patient was identified & brought into the operating room. The patient was positioned supine with arms tucked. SCDs were active during the entire case. The patient underwent general anesthesia without any difficulty.  The abdomen was prepped and draped in a sterile fashion. A Surgical Timeout confirmed our plan.  I made a transverse incision through the superior umbilical fold.  I made a small transverse nick through the infraumbilical fascia and confirmed peritoneal entry.  I placed a 5mm port.  We induced carbon dioxide insufflation.  Camera inspection revealed no injury.  I placed additional ports under direct laparoscopic visualization.  I could see an inflamed tip of the appendix going towards the scar from the prior right salpingo-oophorectomy.  Actually seemed like the tip was going underneath the peritoneum.  Saw no other abnormalities.  I freed the appendix off its attachments to the right pelvic brim layer by layer.  Elevating and staying away from the ureter and gonadal. I elevated the appendix. I skeletonized the mesoappendix. I was able to free off the base of the appendix which was still viable.  I stapled the appendix off the cecum using a laparoscopic stapler. I took a healthy cuff of viable cecum. I ligated the mesoappendix and assured hemostasis in the mesentery using focused cautery and a 0 PDS Endoloop around the appendiceal artery.  I placed the appendix inside an EndoCatch bag and removed out the 12 mm port.  I did copious irrigation. Hemostasis was good in the mesoappendix, colon mesentery, and retroperitoneum. Staple line was intact on the cecum with no bleeding. I washed out the pelvis, retrohepatic space and right paracolic gutter. I washed  out the left side as well.  Hemostasis is good. There was no perforation or injury.  Because the area cleaned up well after irrigation, I did not place a drain.  I aspirated the carbon dioxide. I removed the ports. I closed the 12 mm fascia site  using a 0 Vicryl stitch. I closed skin using 4-0 monocryl stitch.  Patient was extubated and sent to the recovery room.  I suspect the patient is going used in the hospital at least overnight and will need antibiotics overnight.  There is no family to discuss findings.  She did not wish me to call anyone.  Ardeth Sportsman, M.D., F.A.C.S. Gastrointestinal and Minimally Invasive [facility] [address] [place], [place] [address] [phone] Main / Paging

## 2015-07-23 NOTE — Anesthesia Postprocedure Evaluation (Signed)
Anesthesia Post Note  Patient: Kylie Munoz  Procedure(s) Performed: Procedure(s) (LRB): APPENDECTOMY LAPAROSCOPIC (N/A)  Patient location during evaluation: PACU Anesthesia Type: General Level of consciousness: awake and alert Pain management: pain level controlled Vital Signs Assessment: post-procedure vital signs reviewed and stable Respiratory status: spontaneous breathing, nonlabored ventilation, respiratory function stable and patient connected to nasal cannula oxygen Cardiovascular status: blood pressure returned to baseline and stable Postop Assessment: no signs of nausea or vomiting Anesthetic complications: no    Last Vitals:  Filed Vitals:   07/23/15 1049 07/23/15 1605  BP: 140/70   Pulse: 60   Temp: 36.6 C 36.7 C  Resp: 18     Last Pain:  Filed Vitals:   07/23/15 1606  PainSc: 10-Worst pain ever                 [name]

## 2015-07-23 NOTE — ED Notes (Signed)
PA at bedside.

## 2015-07-23 NOTE — ED Notes (Signed)
Pt reports abd pain since 5 AM yesterday admits to N/V/D as well as low abd pain

## 2015-07-23 NOTE — ED Notes (Signed)
20 min warning for bedside report given.

## 2015-07-23 NOTE — ED Provider Notes (Signed)
CSN: [id number]     Arrival date & time 07/23/15  0349 History   First MD Initiated Contact with Patient 07/23/15 [phone]     Chief Complaint  Patient presents with  . Abdominal Pain     (Consider location/radiation/quality/duration/timing/severity/associated sxs/prior Treatment) Patient is a 40 y.o. female presenting with abdominal pain. The history is provided by the patient.  Abdominal Pain Pain location:  Generalized (started Epigastric and B flanks and not is going straight down to be suprapubic) Pain quality comment:  Twisting Pain radiates to:  Does not radiate Pain severity:  Moderate Onset quality:  Gradual Duration:  1 day Timing:  Constant Progression:  Unchanged Chronicity:  Recurrent Context: laxative use   Context: not alcohol use and not trauma   Relieved by:  Nothing Worsened by:  Nothing tried Ineffective treatments:  None tried Associated symptoms: diarrhea, nausea and vomiting   Diarrhea:    Quality:  Watery   Number of occurrences:  3   Severity:  Mild   Timing:  Rare   Progression:  Unchanged Vomiting:    Quality:  Stomach contents   Severity:  Severe   Duration:  1 day   Timing:  Intermittent   Progression:  Unchanged Risk factors: not pregnant and no recent hospitalization   States she was seen for something similar in December and told it was a viral GI illness.  States vomiting started first then pain and diarrhea started this evening after patient took a laxative as she thought it might be food poisoning and she "wanted to get it out."      Past Medical History  Diagnosis Date  . Anxiety and depression   . Obesity   . Anxiety    Past Surgical History  Procedure Laterality Date  . Abdominal hysterectomy  2004    has one ovary  . Cesarean section  2001  . Dental surgery  2006  . Carpal tunnel release Right 2009   Family History  Problem Relation Age of Onset  . Hypertension Mother   . Diabetes Mother   . Heart failure Mother   . Breast  cancer Mother     2006 bilat mast; dx in late 95s and again in late 52s  . Lung cancer Mother   . Hypertension Father   . Diabetes Father   . Heart failure Father   . Breast cancer Maternal Grandmother     bilateral cancer; diagnosed in mid 30s and again in her 14s  . Hypertension Maternal Grandmother   . Diabetes Maternal Grandmother   . Hypertension Maternal Grandfather   . Breast cancer Paternal Grandmother 66  . Stroke Paternal Grandmother   . Hypertension Paternal Grandmother   . Diabetes Paternal Grandmother   . Heart failure Paternal Grandmother   . Hypertension Paternal Grandfather   . Ovarian cancer Paternal Aunt     paternal grandmother's sister  . Ovarian cancer Maternal Aunt     maternal grandmother's sister   Social History  Substance Use Topics  . Smoking status: Former Smoker -- 0.50 packs/day    Types: Cigarettes    Quit date: 02/12/2015  . Smokeless tobacco: Never Used  . Alcohol Use: Yes     Comment: social   OB History    Gravida Para Term Preterm AB TAB SAB Ectopic Multiple Living   2 2        2      Review of Systems  Gastrointestinal: Positive for nausea, vomiting, abdominal pain and diarrhea.  All other systems reviewed and are negative.     Allergies  Iohexol and Tramadol hcl  Home Medications   Prior to Admission medications   Medication Sig Start Date End Date Taking? Authorizing Provider  ALPRAZolam [name](XANAX) 1 MG tablet Take 0.5 tablets (0.5 mg total) by mouth 4 (four) times daily as needed for anxiety or sleep. 07/06/15   Wanda PlumpJose E Paz, MD  fluticasone [facility](FLONASE) 50 MCG/ACT nasal spray Place 2 sprays into both nostrils daily. Patient not taking: Reported on 12/05/2014 06/06/14   Waldon MerlWilliam C Martin, PA-C  GARCINIA CAMBOGIA-CHROMIUM PO Take 1,500 mg by mouth daily.    Historical Provider, MD  ibuprofen (ADVIL,MOTRIN) 200 MG tablet Take 200 mg by mouth every 6 (six) hours as needed (Wrist Pain).    Historical Provider, MD  Misc Natural Products  (COLON CLEANSE) CAPS Take 2 each by mouth daily.    Historical Provider, MD  Multiple Vitamin (MULTIVITAMIN) tablet Take 1 tablet by mouth daily. Nature Made Women's    Historical Provider, MD  omega-3 fish oil (MAXEPA) 1000 MG CAPS capsule Take 1 capsule by mouth daily.    Historical Provider, MD  ondansetron (ZOFRAN) 4 MG tablet Take 1 tablet (4 mg total) by mouth 4 (four) times daily as needed for nausea or vomiting. 02/26/15   Raeford RazorStephen Kohut, MD   BP 108/79 mmHg  Pulse 66  Temp(Src) 98 F (36.7 C) (Oral)  Resp 18  SpO2 97%  LMP 03/13/2002 Physical Exam  Constitutional: She is oriented to person, place, and time. She appears well-developed and well-nourished. No distress.  HENT:  Head: Normocephalic and atraumatic.  Mouth/Throat: Oropharynx is clear and moist.  Eyes: Conjunctivae are normal. Pupils are equal, round, and reactive to light.  Neck: Normal range of motion. Neck supple.  Cardiovascular: Normal rate, regular rhythm and intact distal pulses.   Pulmonary/Chest: Effort normal and breath sounds normal. She has no wheezes. She has no rales.  Abdominal: Soft. Bowel sounds are normal. There is tenderness. There is no rebound and no guarding.  diffuse  Musculoskeletal: Normal range of motion.  Neurological: She is alert and oriented to person, place, and time.  Skin: Skin is warm and dry.  Psychiatric: She has a normal mood and affect.    ED Course  Procedures (including critical care time) Labs Review Labs Reviewed  COMPREHENSIVE METABOLIC PANEL  CBC WITH DIFFERENTIAL/PLATELET  URINALYSIS, ROUTINE W REFLEX MICROSCOPIC (NOT AT [facility])  URINE RAPID DRUG SCREEN, HOSP PERFORMED    Imaging Review No results found. I have personally reviewed and evaluated these images and lab results as part of my medical decision-making.   EKG Interpretation None      MDM   Final diagnoses:  Pain    Filed Vitals:   07/23/15 0357  BP: 108/79  Pulse: 66  Temp: 98 F (36.7 C)   Resp: 18   Results for orders placed or performed during the hospital encounter of 07/23/15  Comprehensive metabolic panel  Result Value Ref Range   Sodium 138 135 - 145 mmol/L   Potassium 3.6 3.5 - 5.1 mmol/L   Chloride 105 101 - 111 mmol/L   CO2 23 22 - 32 mmol/L   Glucose, Bld 117 (H) 65 - 99 mg/dL   BUN <5 (L) 6 - 20 mg/dL   Creatinine, Ser 1.610.52 0.44 - 1.00 mg/dL   Calcium 9.5 8.9 - 09.610.3 mg/dL   Total Protein 7.3 6.5 - 8.1 g/dL   Albumin 4.3 3.5 - 5.0 g/dL  AST 15 15 - 41 U/L   ALT 13 (L) 14 - 54 U/L   Alkaline Phosphatase 63 38 - 126 U/L   Total Bilirubin 0.7 0.3 - 1.2 mg/dL   GFR calc non Af Amer >60 >60 mL/min   GFR calc Af Amer >60 >60 mL/min   Anion gap 10 5 - 15  CBC with Differential/Platelet  Result Value Ref Range   WBC 16.0 (H) 4.0 - 10.5 K/uL   RBC 4.58 3.87 - 5.11 MIL/uL   Hemoglobin 14.5 12.0 - 15.0 g/dL   HCT 57.8 46.9 - 62.9 %   MCV 93.0 78.0 - 100.0 fL   MCH 31.7 26.0 - 34.0 pg   MCHC 34.0 30.0 - 36.0 g/dL   RDW 52.8 41.3 - 24.4 %   Platelets 207 150 - 400 K/uL   Neutrophils Relative % 75 %   Neutro Abs 12.1 (H) 1.7 - 7.7 K/uL   Lymphocytes Relative 19 %   Lymphs Abs 3.0 0.7 - 4.0 K/uL   Monocytes Relative 5 %   Monocytes Absolute 0.8 0.1 - 1.0 K/uL   Eosinophils Relative 1 %   Eosinophils Absolute 0.1 0.0 - 0.7 K/uL   Basophils Relative 0 %   Basophils Absolute 0.0 0.0 - 0.1 K/uL   Ct Renal Stone Study  07/23/2015  CLINICAL DATA:  Right lower quadrant right flank pain for 1 day. EXAM: CT ABDOMEN AND PELVIS WITHOUT CONTRAST TECHNIQUE: Multidetector CT imaging of the abdomen and pelvis was performed following the standard protocol without IV contrast. COMPARISON:  CT 06/03/2008 FINDINGS: Lower chest: The included lung bases are clear. Small hiatal hernia. Liver: No focal lesion allowing for lack contrast. Hepatobiliary: Gallbladder physiologically distended, no calcified stone. No biliary dilatation. Pancreas: No ductal dilatation or inflammation.  Spleen: Normal. Adrenal glands: No nodule. Kidneys: Symmetric in size without stones or hydronephrosis. There is no perinephric stranding. Both ureters are decompressed without stones along the course. Stomach/Bowel: Stomach physiologically distended. There are no dilated or thickened small bowel loops. Colonic diverticulosis from the descending through the sigmoid colon without acute diverticulitis. This is extensive for age in the sigmoid colon. Small volume of stool. Appendix: Dilated measuring 12 mm, fluid-filled, with surrounding periappendiceal fat stranding. No definite appendicolith. No perforation or abscess. Vascular/Lymphatic: Small retroperitoneal lymph nodes, not enlarged by size criteria. Abdominal aorta is normal in caliber. Mild atherosclerosis without aneurysm. Reproductive: The uterus is surgically absent. Left ovary is normal. Post right oophorectomy per report. Bladder: Minimally distended, equivocal wall thickening. Other: No free air, free fluid, or intra-abdominal fluid collection. Musculoskeletal: There are no acute or suspicious osseous abnormalities. Facet arthropathy in the lumbar spine. IMPRESSION: 1. Uncomplicated acute appendicitis. 2. Descending and sigmoid colonic diverticulosis, advanced given patient age. No diverticulitis. Electronically Signed   By: Rubye Oaks M.D.   On: 07/23/2015 04:51     IV antibiotics ordered from the antibiotics order set in EPIC.  Patient is NPO and IVF bolused and saline to continue at 125 cc/hr   Case d/w Dr. Dwain Sarna to the Ed at [facility] to be seen by [facility].  Case d/w Dr. Rhunette Croft who is aware of transfer to the ED.    Cy Blamer, MD 07/23/15 [phone]

## 2015-07-24 ENCOUNTER — Encounter: Payer: Self-pay | Admitting: General Surgery

## 2015-07-24 MED ORDER — IBUPROFEN 200 MG PO TABS: ORAL_TABLET | ORAL | Status: AC

## 2015-07-24 MED ORDER — IBUPROFEN 200 MG PO TABS
600.0000 mg | ORAL_TABLET | Freq: Four times a day (QID) | ORAL | Status: DC | PRN
  Administered 2015-07-24: 600 mg via ORAL
  Filled 2015-07-24: qty 3

## 2015-07-24 MED ORDER — ACETAMINOPHEN 325 MG PO TABS: 650.0000 mg | ORAL_TABLET | Freq: Four times a day (QID) | ORAL | Status: DC | PRN

## 2015-07-24 MED ORDER — HYDROCODONE-ACETAMINOPHEN 5-325 MG PO TABS: 1.0000 | ORAL_TABLET | ORAL | Status: DC | PRN

## 2015-07-24 NOTE — Discharge Summary (Signed)
Physician Discharge Summary  Patient ID: Kylie Munoz MRN: [id number] DOB/AGE: 1975-09-29 40 y.o.  Admit date: 07/23/2015 Discharge date: 07/24/2015  AdHuel Cotemission Diagnoses:  Acute appendicitis  Discharge Diagnoses:  Acute appendicitis   Principal Problem:   Appendicitis, acute, with focal peritonitis s/p lap appendectomy 07/23/2015 Active Problems:   Appendicitis   Obesity   PROCEDURES:  Laparoscopic appendectomy, 07/23/15, Dr. Kalman JewelsSteven Gross  Hospital Course:  40 y/o female tobacco user [facility]. Daughter and son in the room with her. Pt states she began having pain in her upper abdomen around 4:00 am on 5/24. Later that morning she began vomiting and "was vomiting until 3:00am the next day". Describes her pain as sharp and "like someone is twisting my insides really hard" and moving from her upper abdomen down to her lower abdomen/pelvis. Pain is worse with movement and deep inspiration and lessened with IV pain medication.  She denies dizziness, HA, hematemeses, CP, dysuria, hematuria, hematochezia.  Denies a PMH of HTN, DM, CVA, MI Denies use of blood thinners. Past surgical history significant for a c-section in 2001 and an abdominal hysterectomy in 2004. She was seen and taken to the OR on the same day.  She underwent appendectomy and returned to the floor.  She did well post op and was ready to go home the following AM.    Condition on D/C:  Improved    CBC Latest Ref Rng 07/23/2015 02/26/2015 05/27/2013  WBC 4.0 - 10.5 K/uL 16.0(H) 15.4(H) 10.4  Hemoglobin 12.0 - 15.0 g/dL 86.514.5 16.1(H) 15.8(H)  Hematocrit 36.0 - 46.0 % 42.6 46.4(H) 44.7  Platelets 150 - 400 K/uL 207 248 238   CMP Latest Ref Rng 07/23/2015 02/26/2015 05/27/2013  Glucose 65 - 99 mg/dL 784(O117(H) 962(X137(H) 528(U102(H)  BUN 6 - 20 mg/dL <1(L<5(L) 7 10  Creatinine 0.44 - 1.00 mg/dL 2.440.52 0.100.60 2.720.60  Sodium 135 - 145 mmol/L 138 141 136  Potassium 3.5 - 5.1 mmol/L 3.6 3.5 3.7   Chloride 101 - 111 mmol/L 105 110 105  CO2 22 - 32 mmol/L 23 19(L) 24  Calcium 8.9 - 10.3 mg/dL 9.5 9.7 9.5  Total Protein 6.5 - 8.1 g/dL 7.3 7.9 7.1  Total Bilirubin 0.3 - 1.2 mg/dL 0.7 1.0 0.4  Alkaline Phos 38 - 126 U/L 63 68 57  AST 15 - 41 U/L 15 25 11   ALT 14 - 54 U/L 13(L) 16 9     Disposition: 01-Home or Self Care     Medication List    STOP taking these medications        COLON CLEANSE Caps      TAKE these medications        acetaminophen 325 MG tablet  Commonly known as:  TYLENOL  Take 2 tablets (650 mg total) by mouth every 6 (six) hours as needed for mild pain (or temp > 100).     ALPRAZolam 1 MG tablet  Commonly known as:  XANAX  Take 0.5 tablets (0.5 mg total) by mouth 4 (four) times daily as needed for anxiety or sleep.     fluticasone 50 MCG/ACT nasal spray  Commonly known as:  FLONASE  Place 2 sprays into both nostrils daily.     HYDROcodone-acetaminophen 5-325 MG tablet  Commonly known as:  NORCO/VICODIN  Take 1-2 tablets by mouth every 4 (four) hours as needed for moderate pain.     ibuprofen 200 MG tablet  Commonly known as:  ADVIL,MOTRIN  You can take 2-3 tablets every 6 hours as needed for pain     multivitamin tablet  Take 1 tablet by mouth daily. Nature Made Women's     ondansetron 4 MG tablet  Commonly known as:  ZOFRAN  Take 1 tablet (4 mg total) by mouth 4 (four) times daily as needed for nausea or vomiting.           Follow-up Information    Follow up with [facility] On 08/05/2015.   Specialty:  General Surgery   Why:  Your appointment is at 11:30 AM, be at the office 30 minutes early for check in.   Contact information:   [address] [place] [address] [phone]       Signed: Sherrie George 07/24/2015, 2:33 PM

## 2015-07-24 NOTE — Discharge Instructions (Signed)
[facility] ______[facility] °LAPAROSCOPIC SURGERY: POST OP INSTRUCTIONS °Always review your discharge instruction sheet given to you by the facility where your surgery was performed. °IF YOU HAVE DISABILITY OR FAMILY LEAVE FORMS, YOU MUST BRING THEM TO THE OFFICE FOR PROCESSING.   °DO NOT GIVE THEM TO YOUR DOCTOR. ° °1. A prescription for pain medication may be given to you upon discharge.  Take your pain medication as prescribed, if needed.  If narcotic pain medicine is not needed, then you may take acetaminophen (Tylenol) or ibuprofen (Advil) as needed. °2. Take your usually prescribed medications unless otherwise directed. °3. If you need a refill on your pain medication, please contact your pharmacy.  They will contact our office to request authorization. Prescriptions will not be filled after 5pm or on week-ends. °4. You should follow a light diet the first few days after arrival home, such as soup and crackers, etc.  Be sure to include lots of fluids daily. °5. Most patients will experience some swelling and bruising in the area of the incisions.  Ice packs will help.  Swelling and bruising can take several days to resolve.  °6. It is common to experience some constipation if taking pain medication after surgery.  Increasing fluid intake and taking a stool softener (such as Colace) will usually help or prevent this problem from occurring.  A mild laxative (Milk of Magnesia or Miralax) should be taken according to package instructions if there are no bowel movements after 48 hours. °7. Unless discharge instructions indicate otherwise, you may remove your bandages 24-48 hours after surgery, and you may shower at that time.  You may have steri-strips (small skin tapes) in place directly over the incision.  These strips should be left on the skin for 7-10 days.  If your surgeon used skin glue on the incision, you may shower in 24 hours.  The glue will flake off over the next 2-3 weeks.  Any sutures or  staples will be removed at the office during your follow-up visit. °8. ACTIVITIES:  You may resume regular (light) daily activities beginning the next day--such as daily self-care, walking, climbing stairs--gradually increasing activities as tolerated.  You may have sexual intercourse when it is comfortable.  Refrain from any heavy lifting or straining until approved by your doctor. °a. You may drive when you are no longer taking prescription pain medication, you can comfortably wear a seatbelt, and you can safely maneuver your car and apply brakes. °b. RETURN TO WORK:  __________________________________________________________ °9. You should see your doctor in the office for a follow-up appointment approximately 2-3 weeks after your surgery.  Make sure that you call for this appointment within a day or two after you arrive home to insure a convenient appointment time. °10. OTHER INSTRUCTIONS: __________________________________________________________________________________________________________________________ __________________________________________________________________________________________________________________________ °WHEN TO CALL YOUR DOCTOR: °1. Fever over 101.0 °2. Inability to urinate °3. Continued bleeding from incision. °4. Increased pain, redness, or drainage from the incision. °5. Increasing abdominal pain ° °The clinic staff is available to answer your questions during regular business hours.  Please don’t hesitate to call and ask to speak to one of the nurses for clinical concerns.  If you have a medical emergency, go to the nearest emergency room or call 911.  A surgeon from [facility] is always on call at the hospital. °[address], [address], [place], [place]  [address] ? [address] [address], [place], [place]   [address] °[phone] ? [phone] ? FAX [phone] °Web site:   [email or web address] ° °Laparoscopic Appendectomy, Adult, Care After °Refer to  this sheet in the next few weeks. These instructions provide you with information on caring for yourself after your procedure. Your caregiver may also give you more specific instructions. Your treatment has been planned according to current medical practices, but problems sometimes occur. Call your caregiver if you have any problems or questions after your procedure. °HOME CARE INSTRUCTIONS °· Do not drive while taking narcotic pain medicines. °· Use stool softener if you become constipated from your pain medicines. °· Change your bandages (dressings) as directed. °· Keep your wounds clean and dry. You may wash the wounds gently with soap and water. Gently pat the wounds dry with a clean towel. °· Do not take baths, swim, or use hot tubs for 10 days, or as instructed by your caregiver. °· Only take over-the-counter or prescription medicines for pain, discomfort, or fever as directed by your caregiver. °· You may continue your normal diet as directed. °· Do not lift more than 10 pounds (4.5 kg) or play contact sports for 3 weeks, or as directed. °· Slowly increase your activity after surgery. °· Take deep breaths to avoid getting a lung infection (pneumonia). °SEEK MEDICAL CARE IF: °· You have redness, swelling, or increasing pain in your wounds. °· You have pus coming from your wounds. °· You have drainage from a wound that lasts longer than 1 day. °· You notice a bad smell coming from the wounds or dressing. °· Your wound edges break open after stitches (sutures) have been removed. °· You notice increasing pain in the shoulders (shoulder strap areas) or near your shoulder blades. °· You develop dizzy episodes or fainting while standing. °· You develop shortness of breath. °· You develop persistent nausea or vomiting. °· You cannot control your bowel functions or lose your appetite. °· You develop diarrhea. °SEEK IMMEDIATE MEDICAL CARE IF:  °· You have a fever. °· You develop a rash. °· You have difficulty breathing  or sharp pains in your chest. °· You develop any reaction or side effects to medicines given. °MAKE SURE YOU: °· Understand these instructions. °· Will watch your condition. °· Will get help right away if you are not doing well or get worse. °  °This information is not intended to replace advice given to you by your health care provider. Make sure you discuss any questions you have with your health care provider. °  °Document Released: 02/14/2005 Document Revised: 07/01/2014 Document Reviewed: 08/04/2014 °Elsevier Interactive Patient Education ©2016 [employer] Inc. ° ° ° °

## 2015-07-24 NOTE — Progress Notes (Signed)
1 Day Post-Op  Subjective: Looks like she is in pain, just got the pain meds.  Has not eaten breakfast.  Sites look fine.  She would like to go home today.    Objective: Vital signs in last 24 hours: Temp:  [97.6 F (36.4 C)-98.2 F (36.8 C)] 97.9 F (36.6 C) (05/26 1000) Pulse Rate:  [51-88] 51 (05/26 1000) Resp:  [10-20] 18 (05/26 1000) BP: (102-140)/(57-88) 109/57 mmHg (05/26 1000) SpO2:  [97 %-100 %] 97 % (05/26 1000) Last BM Date: 07/23/15 1140 PO Urine 2100 Afebrile, VSS No labs today Drug screen + THC   Intake/Output from previous day: 05/25 0701 - 05/26 0700 In: 4051.7 [P.O.:1140; I.V.:2911.7] Out: 2125 [Urine:2100; Blood:25] Intake/Output this shift: Total I/O In: 240 [P.O.:240] Out: 0   General appearance: alert, cooperative and no distress Resp: clear to auscultation bilaterally GI: soft sore, sites look fine, + bS  Lab Results:   Recent Labs  07/23/15 0425  WBC 16.0*  HGB 14.5  HCT 42.6  PLT 207    BMET  Recent Labs  07/23/15 0425  NA 138  K 3.6  CL 105  CO2 23  GLUCOSE 117*  BUN <5*  CREATININE 0.52  CALCIUM 9.5   PT/INR No results for input(s): LABPROT, INR in the last 72 hours.   Recent Labs Lab 07/23/15 0425  AST 15  ALT 13*  ALKPHOS 63  BILITOT 0.7  PROT 7.3  ALBUMIN 4.3     Lipase     Component Value Date/Time   LIPASE 29 02/26/2015 0840     Studies/Results: Ct Renal Stone Study  07/23/2015  CLINICAL DATA:  Right lower quadrant right flank pain for 1 day. EXAM: CT ABDOMEN AND PELVIS WITHOUT CONTRAST TECHNIQUE: Multidetector CT imaging of the abdomen and pelvis was performed following the standard protocol without IV contrast. COMPARISON:  CT 06/03/2008 FINDINGS: Lower chest: The included lung bases are clear. Small hiatal hernia. Liver: No focal lesion allowing for lack contrast. Hepatobiliary: Gallbladder physiologically distended, no calcified stone. No biliary dilatation. Pancreas: No ductal dilatation or  inflammation. Spleen: Normal. Adrenal glands: No nodule. Kidneys: Symmetric in size without stones or hydronephrosis. There is no perinephric stranding. Both ureters are decompressed without stones along the course. Stomach/Bowel: Stomach physiologically distended. There are no dilated or thickened small bowel loops. Colonic diverticulosis from the descending through the sigmoid colon without acute diverticulitis. This is extensive for age in the sigmoid colon. Small volume of stool. Appendix: Dilated measuring 12 mm, fluid-filled, with surrounding periappendiceal fat stranding. No definite appendicolith. No perforation or abscess. Vascular/Lymphatic: Small retroperitoneal lymph nodes, not enlarged by size criteria. Abdominal aorta is normal in caliber. Mild atherosclerosis without aneurysm. Reproductive: The uterus is surgically absent. Left ovary is normal. Post right oophorectomy per report. Bladder: Minimally distended, equivocal wall thickening. Other: No free air, free fluid, or intra-abdominal fluid collection. Musculoskeletal: There are no acute or suspicious osseous abnormalities. Facet arthropathy in the lumbar spine. IMPRESSION: 1. Uncomplicated acute appendicitis. 2. Descending and sigmoid colonic diverticulosis, advanced given patient age. No diverticulitis. Electronically Signed   By: Rubye Oaks M.D.   On: 07/23/2015 04:51    Medications:   Prior to Admission medications   Medication Sig Start Date End Date Taking? Authorizing Provider  ALPRAZolam [name]) 1 MG tablet Take 0.5 tablets (0.5 mg total) by mouth 4 (four) times daily as needed for anxiety or sleep. 07/06/15  Yes Wanda Plump, MD  Misc Natural Products (COLON CLEANSE) CAPS Take 2 each  by mouth daily.   Yes Historical Provider, MD  Multiple Vitamin (MULTIVITAMIN) tablet Take 1 tablet by mouth daily. [employer] Made Women's   Yes Historical Provider, MD  fluticasone (FLONASE) 50 MCG/ACT nasal spray Place 2 sprays into both nostrils  daily. Patient not taking: Reported on 12/05/2014 06/06/14   Waldon MerlWilliam C Martin, PA-C  ondansetron (ZOFRAN) 4 MG tablet Take 1 tablet (4 mg total) by mouth 4 (four) times daily as needed for nausea or vomiting. Patient not taking: Reported on 07/23/2015 02/26/15   Raeford RazorStephen Kohut, MD   . 0.9 % NaCl with KCl 20 mEq / L 1,000 mL (07/24/15 [id number])   Assessment/Plan Acute appendicitis S/p laparoscopic Appendectomy 07/23/15 Tobacco use FEN:  Regular diet ID:  Pre op only DVT: SCD  Plan:  Home later today, I want her to eat before she goes and walk with her kids when they  get here, and before she goes home.  Discussed care at home.        Sherrie GeorgeJENNINGS,Sorina Derrig 07/24/2015 [phone]

## 2015-08-12 ENCOUNTER — Telehealth: Payer: Self-pay | Admitting: Internal Medicine

## 2015-08-12 NOTE — Telephone Encounter (Signed)
Relation to WU:JWJXpt:self Call back [phone] Pharmacy:  Reason for call:  [facility] and has a few clinical questions regarding her surgery, patient didn't want to elaborate and would like to speak with a nurse.

## 2015-08-12 NOTE — Telephone Encounter (Signed)
Please advise pt that clinical questions regarding her surgery should be directed to her surgeon's office [facility]).

## 2015-08-12 NOTE — Telephone Encounter (Signed)
Patient informed. 

## 2015-08-14 ENCOUNTER — Encounter: Payer: Self-pay | Admitting: Genetic Counselor

## 2015-11-03 ENCOUNTER — Other Ambulatory Visit: Payer: Self-pay | Admitting: Internal Medicine

## 2015-11-03 NOTE — Telephone Encounter (Signed)
Okay #30, 1 refill, needs office visit before future refills

## 2015-11-03 NOTE — Telephone Encounter (Signed)
Pt is requesting refill on Alprazolam.  Last OV: 12/05/2014 Last Fill: 07/06/2015 #30 and 3RF UDS: 12/04/2014 Low risk  Please advise.

## 2015-11-03 NOTE — Telephone Encounter (Signed)
Rx faxed to [facility] pharmacy.  

## 2015-11-03 NOTE — Telephone Encounter (Signed)
Rx printed, awaiting MD signature.  

## 2015-12-28 ENCOUNTER — Ambulatory Visit (INDEPENDENT_AMBULATORY_CARE_PROVIDER_SITE_OTHER): Payer: BLUE CROSS/BLUE SHIELD | Admitting: Family Medicine

## 2015-12-28 DIAGNOSIS — Z23 Encounter for immunization: Secondary | ICD-10-CM | POA: Diagnosis not present

## 2016-01-13 ENCOUNTER — Encounter: Payer: Self-pay | Admitting: Family Medicine

## 2016-01-13 ENCOUNTER — Ambulatory Visit: Payer: BLUE CROSS/BLUE SHIELD | Admitting: Family Medicine

## 2016-01-13 ENCOUNTER — Ambulatory Visit (INDEPENDENT_AMBULATORY_CARE_PROVIDER_SITE_OTHER): Payer: BLUE CROSS/BLUE SHIELD | Admitting: Family Medicine

## 2016-01-13 VITALS — BP 116/72 | HR 71 | Temp 98.3°F | Resp 16 | Ht 66.0 in | Wt 205.4 lb

## 2016-01-13 DIAGNOSIS — S6391XA Sprain of unspecified part of right wrist and hand, initial encounter: Secondary | ICD-10-CM

## 2016-01-13 DIAGNOSIS — F411 Generalized anxiety disorder: Secondary | ICD-10-CM | POA: Diagnosis not present

## 2016-01-13 MED ORDER — ALPRAZOLAM 1 MG PO TABS: 0.5000 mg | ORAL_TABLET | Freq: Four times a day (QID) | ORAL | 0 refills | Status: DC | PRN

## 2016-01-13 MED ORDER — HYDROCODONE-ACETAMINOPHEN 5-325 MG PO TABS: 1.0000 | ORAL_TABLET | Freq: Four times a day (QID) | ORAL | 0 refills | Status: DC | PRN

## 2016-01-13 NOTE — Progress Notes (Signed)
Pre visit review using our clinic review tool, if applicable. No additional management support is needed unless otherwise documented below in the visit note/SLS  

## 2016-01-13 NOTE — Progress Notes (Signed)
[facility] [address], [address] [place], [place] [phone] [phone]  Date:  01/13/2016   Name:  Kylie CoteMary C Munoz   DOB:  [birth year]   MRN:  [id number]  PCP:  Willow OraJose Paz, MD    Chief Complaint: Hand Pain (Patient c/o Right hand pain with numbness, tingling & sharp pains and swelling x5 days)   History of Present Illness:  Kylie Munoz is a 40 y.o. very pleasant female patient who presents with the following:  Today is [name].  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  She notes swelling and continues to have severe pain in her hand and wrist.    She has a history of CTS in both hand and did have surgery on her right hand years ago; she thinks in 2009.   She is taking medications OTC but they are not really helping.   She does have a wrist brace that she is wearing mostly just at night.    She notes numbness and tingling in the hand that came on over the weekend- Saturday night.  The arm will throb and is quite painful  She is s/p hysterectomy   She is right handed  She has still been working "all the time" but admits she has been in a lot of pain  Reviewed [facility]- she does get xanax from Dr. Drue NovelPaz on a regular basis but last rx for hydrocodone was in May of this year.  She is due for a refill of her xanax so I will do this for her today  Patient Active Problem List   Diagnosis Date Noted  . Appendicitis, acute, with focal peritonitis s/p lap appendectomy 07/23/2015 07/23/2015  . Appendicitis 07/23/2015  . Obesity   . PCP NOTES >>> 12/06/2014  . Sore throat 01/28/2014  . Carpal tunnel syndrome of left wrist 09/25/2013  . Abnormal vaginal Pap smear 10/06/2011  . Family history of breast cancer in first degree relative 09/20/2011  . Anxiety and depression 12/08/2006    Past Medical History:  Diagnosis Date  . Acute bacterial sinusitis  06/06/2014  . Anxiety   . Anxiety and depression   . Obesity     Past Surgical History:  Procedure Laterality Date  . ABDOMINAL HYSTERECTOMY  2004   has left ovary  . CARPAL TUNNEL RELEASE Right 2009  . CESAREAN SECTION  2001  . DENTAL SURGERY  2006  . LAPAROSCOPIC APPENDECTOMY N/A 07/23/2015   Procedure: APPENDECTOMY LAPAROSCOPIC;  Surgeon: Karie SodaSteven Gross, MD;  Location: [facility];  Service: General;  Laterality: N/A;  . UNILATERAL SALPINGECTOMY Right 2004    Social History  Substance Use Topics  . Smoking status: Former Smoker    Packs/day: 0.50    Types: Cigarettes    Quit date: 02/12/2015  . Smokeless tobacco: Never Used  . Alcohol use Yes     Comment: social    Family History  Problem Relation Age of Onset  . Hypertension Mother   . Diabetes Mother   . Heart failure Mother   . Breast cancer Mother     2006 bilat mast; dx in late 7440s and again in late 4550s  . Lung cancer Mother   . Hypertension Father   . Diabetes Father   . Heart failure Father   . Breast cancer Maternal Grandmother  bilateral cancer; diagnosed in mid 30s and again in her 150s  . Hypertension Maternal Grandmother   . Diabetes Maternal Grandmother   . Hypertension Maternal Grandfather   . Breast cancer Paternal Grandmother 3250  . Stroke Paternal Grandmother   . Hypertension Paternal Grandmother   . Diabetes Paternal Grandmother   . Heart failure Paternal Grandmother   . Hypertension Paternal Grandfather   . Ovarian cancer Paternal Aunt     paternal grandmother's sister  . Ovarian cancer Maternal Aunt     maternal grandmother's sister    Allergies  Allergen Reactions  . Iohexol      Code: HIVES, Desc: PT had a near syncope event,developed hives & difficulty swallowing post injection of 125 ml Omni 300.Treated by Dr.McAlhany with 50 mg PO, 25mg  IV benedryl, 125 mg solu-cortef, 0.3 mg Epinephrine IM, 40 mg famotidine PO. [facility], Onset Date: [id number]   . Tramadol Hcl      REACTION: itching    Medication list has been reviewed and updated.  Current Outpatient Prescriptions on File Prior to Visit  Medication Sig Dispense Refill  . acetaminophen (TYLENOL) 325 MG tablet Take 2 tablets (650 mg total) by mouth every 6 (six) hours as needed for mild pain (or temp > 100).    . ALPRAZolam (XANAX) 1 MG tablet Take 0.5 tablets (0.5 mg total) by mouth 4 (four) times daily as needed for anxiety or sleep. 30 tablet 1  . ibuprofen (ADVIL,MOTRIN) 200 MG tablet You can take 2-3 tablets every 6 hours as needed for pain    . Multiple Vitamin (MULTIVITAMIN) tablet Take 1 tablet by mouth daily. [employer] Women's     No current facility-administered medications on file prior to visit.     Review of Systems:  As per HPI- otherwise negative.   Physical Examination: Vitals:   01/13/16 1306  BP: 116/72  Pulse: 71  Resp: 16  Temp: 98.3 F (36.8 C)   Vitals:   01/13/16 1306  Weight: 205 lb 6 oz (93.2 kg)  Height: 5\' 6"  (1.676 m)   Body mass index is 33.15 kg/m. Ideal Body Weight: Weight in (lb) to have BMI = 25: 154.6  GEN: WDWN, NAD, Non-toxic, A & O x 3, overweight, appears uncomfortable HEENT: Atraumatic, Normocephalic. Neck supple. No masses, No LAD. Ears and Nose: No external deformity. CV: RRR, No M/G/R. No JVD. No thrill. No extra heart sounds. PULM: CTA B, no wheezes, crackles, rhonchi. No retractions. No resp. distress. No accessory muscle use. EXTR: No c/c/e NEURO Normal gait.  PSYCH: Normally interactive. Conversant. Not depressed or anxious appearing.  Calm demeanor.  Right hand: she notes tenderness and has slight swelling over the carpal tunnel of the right ventral wrist.  She has pain with resisted extension of the index, long and ring fingers. No heat, normal cap refill Elbow negative   Assessment and Plan: Hand sprain, right, initial encounter - Plan: HYDROcodone-acetaminophen (NORCO/VICODIN) 5-325 MG tablet  GAD (generalized anxiety  disorder) - Plan: ALPRAZolam (XANAX) 1 MG tablet   Here today with a sprain of her right hand that occurred about 5 days ago when carrying a heavy pan at work.  This seems to have caused an injury to one or more of the flexor tendons of her right hand.  Sports med kindly agreed to see pt today- suspect she needs a more substantial splint and may need US or other imaging evaluation   Gave rx for hydrocodone to use for  pain, and xanax as per her usual routine for anxiety.  Cautioned her that if these medications are used together she will need to use a reduced dose of one or both medications, and cautioned regarding sedation  Signed Abbe Amsterdam, MD

## 2016-01-13 NOTE — Patient Instructions (Signed)
Use the pain medication as needed- remember this will make you sleepy!   Please go to the sports medicine office [id number] a little before 3 pm to be seen for your wrist problem I hope that you feel better very soon

## 2016-01-14 ENCOUNTER — Ambulatory Visit: Payer: BLUE CROSS/BLUE SHIELD | Admitting: Medical

## 2016-01-14 ENCOUNTER — Ambulatory Visit: Payer: BLUE CROSS/BLUE SHIELD | Admitting: Family Medicine

## 2016-02-15 ENCOUNTER — Other Ambulatory Visit: Payer: Self-pay | Admitting: Family Medicine

## 2016-02-15 DIAGNOSIS — F411 Generalized anxiety disorder: Secondary | ICD-10-CM

## 2016-02-15 MED ORDER — ALPRAZOLAM 1 MG PO TABS: 0.5000 mg | ORAL_TABLET | Freq: Four times a day (QID) | ORAL | 0 refills | Status: DC | PRN

## 2016-02-15 NOTE — Telephone Encounter (Signed)
Received refill request for ALPRAZOLAM 1MG  TABLETS. Last office visit and refill 01/13/16. Is it ok to refill? Please advise.

## 2016-02-15 NOTE — Addendum Note (Signed)
Addended by: Abbe AmsterdamOPLAND, Kajuan Guyton C on: 02/15/2016 06:02 PM   Modules accepted: Orders

## 2016-02-17 ENCOUNTER — Other Ambulatory Visit: Payer: Self-pay | Admitting: Family Medicine

## 2016-03-21 ENCOUNTER — Other Ambulatory Visit: Payer: Self-pay | Admitting: Family Medicine

## 2016-03-21 DIAGNOSIS — F411 Generalized anxiety disorder: Secondary | ICD-10-CM

## 2016-03-21 NOTE — Telephone Encounter (Signed)
Received refill request for ALPRAZOLAM 1MG  TABLETS. Last office visit 01/13/2016 and last refill 02/15/16. Is it ok to refill? Please advise.

## 2016-05-23 ENCOUNTER — Telehealth: Payer: Self-pay | Admitting: Internal Medicine

## 2016-05-23 NOTE — Telephone Encounter (Signed)
°  Relation to pt: self  Call back number:[phone]  Reason for call:  Patient requesting a refill ALPRAZolam (XANAX) 1 MG tablet

## 2016-05-23 NOTE — Telephone Encounter (Signed)
Patient states she will check her work schedule and call back

## 2016-05-23 NOTE — Telephone Encounter (Signed)
Per office policy, unable to refill as it is a controlled substance and Pt has not been seen by PCP since 11/2014. Will need appt for refills.

## 2016-08-04 ENCOUNTER — Ambulatory Visit (INDEPENDENT_AMBULATORY_CARE_PROVIDER_SITE_OTHER): Payer: Self-pay | Admitting: Family Medicine

## 2016-08-04 ENCOUNTER — Encounter: Payer: Self-pay | Admitting: Family Medicine

## 2016-08-04 VITALS — BP 124/65 | HR 79 | Temp 98.2°F | Resp 16 | Ht 66.0 in | Wt 209.6 lb

## 2016-08-04 DIAGNOSIS — F411 Generalized anxiety disorder: Secondary | ICD-10-CM

## 2016-08-04 DIAGNOSIS — J0140 Acute pansinusitis, unspecified: Secondary | ICD-10-CM

## 2016-08-04 DIAGNOSIS — J324 Chronic pansinusitis: Secondary | ICD-10-CM

## 2016-08-04 MED ORDER — FLUCONAZOLE 150 MG PO TABS: 150.0000 mg | ORAL_TABLET | Freq: Every day | ORAL | 2 refills | Status: DC

## 2016-08-04 MED ORDER — ALPRAZOLAM 1 MG PO TABS: 0.5000 mg | ORAL_TABLET | Freq: Four times a day (QID) | ORAL | 0 refills | Status: DC | PRN

## 2016-08-04 MED ORDER — AMOXICILLIN-POT CLAVULANATE 875-125 MG PO TABS: 1.0000 | ORAL_TABLET | Freq: Two times a day (BID) | ORAL | 0 refills | Status: DC

## 2016-08-04 NOTE — Patient Instructions (Signed)

## 2016-08-04 NOTE — Assessment & Plan Note (Signed)
abx per orders xyzal and nasocort

## 2016-08-04 NOTE — Progress Notes (Signed)
0 Patient ID: Kylie Munoz, female    DOB: [birth year]  Age: 41 y.o. MRN: [id number]    Subjective:  Subjective  HPI Kylie Munoz presents for sinus congestion and st , ear pain since last Sat.  She has taking otc for   Review of Systems  Constitutional: Negative for appetite change, chills, diaphoresis, fatigue, fever and unexpected weight change.  HENT: Positive for congestion, postnasal drip, rhinorrhea, sinus pain, sinus pressure and sore throat.   Eyes: Negative for pain, redness and visual disturbance.  Respiratory: Negative for cough, chest tightness, shortness of breath and wheezing.   Cardiovascular: Negative for chest pain, palpitations and leg swelling.  Endocrine: Negative for cold intolerance, heat intolerance, polydipsia, polyphagia and polyuria.  Genitourinary: Negative for difficulty urinating, dysuria and frequency.  Allergic/Immunologic: Negative for environmental allergies.  Neurological: Negative for dizziness, light-headedness, numbness and headaches.    History Past Medical History:  Diagnosis Date  . Acute bacterial sinusitis 06/06/2014  . Anxiety   . Anxiety and depression   . Obesity     She has a past surgical history that includes Abdominal hysterectomy (2004); Cesarean section (2001); Dental surgery (2006); Carpal tunnel release (Right, 2009); Unilateral salpingectomy (Right, 2004); and laparoscopic appendectomy (N/A, 07/23/2015).   Her family history includes Breast cancer in her maternal grandmother and mother; Breast cancer (age of onset: 19) in her paternal grandmother; Diabetes in her father, maternal grandmother, mother, and paternal grandmother; Heart failure in her father, mother, and paternal grandmother; Hypertension in her father, maternal grandfather, maternal grandmother, mother, paternal grandfather, and paternal grandmother; Lung cancer in her mother; Ovarian cancer in her maternal aunt and paternal aunt; Stroke in her paternal grandmother.She  reports that she quit smoking about 17 months ago. Her smoking use included Cigarettes. She smoked 0.50 packs per day. She has never used smokeless tobacco. She reports that she drinks alcohol. She reports that she does not use drugs.  Current Outpatient Prescriptions on File Prior to Visit  Medication Sig Dispense Refill  . acetaminophen (TYLENOL) 325 MG tablet Take 2 tablets (650 mg total) by mouth every 6 (six) hours as needed for mild pain (or temp > 100).    Marland Kitchen HYDROcodone-acetaminophen (NORCO/VICODIN) 5-325 MG tablet Take 1 tablet by mouth every 6 (six) hours as needed. 20 tablet 0  . ibuprofen (ADVIL,MOTRIN) 200 MG tablet You can take 2-3 tablets every 6 hours as needed for pain    . Multiple Vitamin (MULTIVITAMIN) tablet Take 1 tablet by mouth daily. [employer]     No current facility-administered medications on file prior to visit.      Objective:  Objective  Physical Exam  Constitutional: She is oriented to person, place, and time. She appears well-developed and well-nourished.  HENT:  Head: Normocephalic and atraumatic.  Right Ear: External ear normal.  Left Ear: External ear normal.  Nose: Rhinorrhea present. Right sinus exhibits maxillary sinus tenderness and frontal sinus tenderness. Left sinus exhibits maxillary sinus tenderness and frontal sinus tenderness.  Mouth/Throat: Posterior oropharyngeal erythema present. No posterior oropharyngeal edema.  + PND + errythema  Eyes: Conjunctivae and EOM are normal. Right eye exhibits no discharge. Left eye exhibits no discharge.  Neck: Normal range of motion. Neck supple. No JVD present. Carotid bruit is not present. No thyromegaly present.  Cardiovascular: Normal rate, regular rhythm and normal heart sounds.   No murmur heard. Pulmonary/Chest: Effort normal and breath sounds normal. No respiratory distress. She has no wheezes. She has no rales. She exhibits  no tenderness.  Musculoskeletal: She exhibits no edema.    Lymphadenopathy:    She has cervical adenopathy.  Neurological: She is alert and oriented to person, place, and time.  Psychiatric: She has a normal mood and affect.  Nursing note and vitals reviewed.  BP 124/65 (BP Location: Right Arm, Patient Position: Sitting, Cuff Size: Large)   Pulse 79   Temp 98.2 F (36.8 C) (Oral)   Resp 16   Ht 5\' 6"  (1.676 m)   Wt 209 lb 9.6 oz (95.1 kg)   LMP 03/13/2002   SpO2 98%   BMI 33.83 kg/m  Wt Readings from Last 3 Encounters:  08/04/16 209 lb 9.6 oz (95.1 kg)  01/13/16 205 lb 6 oz (93.2 kg)  02/26/15 175 lb (79.4 kg)     Lab Results  Component Value Date   WBC 16.0 (H) 07/23/2015   HGB 14.5 07/23/2015   HCT 42.6 07/23/2015   PLT 207 07/23/2015   GLUCOSE 117 (H) 07/23/2015   CHOL 201 (H) 05/27/2013   TRIG 145.0 08/31/2011   HDL 51.60 08/31/2011   LDLDIRECT 174.7 08/31/2011   ALT 13 (L) 07/23/2015   AST 15 07/23/2015   NA 138 07/23/2015   K 3.6 07/23/2015   CL 105 07/23/2015   CREATININE 0.52 07/23/2015   BUN <5 (L) 07/23/2015   CO2 23 07/23/2015   TSH 1.917 05/27/2013    Ct Renal Stone Study  Result Date: 07/23/2015 CLINICAL DATA:  Right lower quadrant right flank pain for 1 day. EXAM: CT ABDOMEN AND PELVIS WITHOUT CONTRAST TECHNIQUE: Multidetector CT imaging of the abdomen and pelvis was performed following the standard protocol without IV contrast. COMPARISON:  CT 06/03/2008 FINDINGS: Lower chest: The included lung bases are clear. Small hiatal hernia. Liver: No focal lesion allowing for lack contrast. Hepatobiliary: Gallbladder physiologically distended, no calcified stone. No biliary dilatation. Pancreas: No ductal dilatation or inflammation. Spleen: Normal. Adrenal glands: No nodule. Kidneys: Symmetric in size without stones or hydronephrosis. There is no perinephric stranding. Both ureters are decompressed without stones along the course. Stomach/Bowel: Stomach physiologically distended. There are no dilated or thickened small  bowel loops. Colonic diverticulosis from the descending through the sigmoid colon without acute diverticulitis. This is extensive for age in the sigmoid colon. Small volume of stool. Appendix: Dilated measuring 12 mm, fluid-filled, with surrounding periappendiceal fat stranding. No definite appendicolith. No perforation or abscess. Vascular/Lymphatic: Small retroperitoneal lymph nodes, not enlarged by size criteria. Abdominal aorta is normal in caliber. Mild atherosclerosis without aneurysm. Reproductive: The uterus is surgically absent. Left ovary is normal. Post right oophorectomy per report. Bladder: Minimally distended, equivocal wall thickening. Other: No free air, free fluid, or intra-abdominal fluid collection. Musculoskeletal: There are no acute or suspicious osseous abnormalities. Facet arthropathy in the lumbar spine. IMPRESSION: 1. Uncomplicated acute appendicitis. 2. Descending and sigmoid colonic diverticulosis, advanced given patient age. No diverticulitis. Electronically Signed   By: Rubye Oaks M.D.   On: 07/23/2015 04:51     Assessment & Plan:  Plan  I am having Ms. Gowans start on amoxicillin-clavulanate and fluconazole. I am also having her maintain her multivitamin, acetaminophen, ibuprofen, HYDROcodone-acetaminophen, and ALPRAZolam.  Meds ordered this encounter  Medications  . ALPRAZolam (XANAX) 1 MG tablet    Sig: Take 0.5 tablets (0.5 mg total) by mouth 4 (four) times daily as needed for anxiety or sleep.    Dispense:  30 tablet    Refill:  0  . amoxicillin-clavulanate (AUGMENTIN) 875-125 MG tablet  Sig: Take 1 tablet by mouth 2 (two) times daily.    Dispense:  20 tablet    Refill:  0  . fluconazole (DIFLUCAN) 150 MG tablet    Sig: Take 1 tablet (150 mg total) by mouth daily. May repeat in 3 days prn    Dispense:  2 tablet    Refill:  2    Problem List Items Addressed This Visit      Unprioritized   Acute pansinusitis - Primary    abx per orders xyzal and  nasocort       Relevant Medications   amoxicillin-clavulanate (AUGMENTIN) 875-125 MG tablet   fluconazole (DIFLUCAN) 150 MG tablet    Other Visit Diagnoses    GAD (generalized anxiety disorder)       Relevant Medications   ALPRAZolam (XANAX) 1 MG tablet   Chronic pansinusitis       Relevant Medications   amoxicillin-clavulanate (AUGMENTIN) 875-125 MG tablet   fluconazole (DIFLUCAN) 150 MG tablet      Follow-up: Return if symptoms worsen or fail to improve.  Donato SchultzYvonne R Lowne Chase, DO

## 2016-08-05 ENCOUNTER — Encounter: Payer: Self-pay | Admitting: Family Medicine

## 2016-09-12 ENCOUNTER — Telehealth: Payer: Self-pay | Admitting: Internal Medicine

## 2016-09-12 DIAGNOSIS — F411 Generalized anxiety disorder: Secondary | ICD-10-CM

## 2016-09-12 MED ORDER — ALPRAZOLAM 1 MG PO TABS: 0.5000 mg | ORAL_TABLET | Freq: Four times a day (QID) | ORAL | 0 refills | Status: DC | PRN

## 2016-09-12 NOTE — Telephone Encounter (Signed)
[facility]. LMOM informing Pt that 1 week supply has been faxed to pharmacy. Instructed that no further refills from PCP w/o appt, informed Pt of PCP hours this week, also informed Pt of new upcoming federal laws that Pts on controlled substances will have to be seen every 6 months. Instructed her to call office to schedule appt and let me know if questions/concerns.

## 2016-09-12 NOTE — Telephone Encounter (Signed)
Rx printed, awaiting MD signature. [place] Controlled Substance Database printed, no issues noted.

## 2016-09-12 NOTE — Telephone Encounter (Signed)
Send one week supply, office visit this week. Overbook  okay

## 2016-09-12 NOTE — Telephone Encounter (Signed)
Please advise, last OV w/ PCP 2016.

## 2016-09-12 NOTE — Telephone Encounter (Signed)
Caller name: Mila HomerMary Mcgarvey Relationship to patient: self Can be reached: [phone] Pharmacy: [facility] [phone] - [place], [place] - [address] [facility] [address]  Reason for call: Pt requesting refill of Alprazolam. She is out. States she has been in recently but had to see Dr. Laury AxonLowne. Advised pt she may need f/u with PCP for refills. Pt states she tries but he is either out of office or not available. Declined to schedule.

## 2017-07-11 ENCOUNTER — Other Ambulatory Visit: Payer: Self-pay

## 2017-07-11 ENCOUNTER — Emergency Department (HOSPITAL_BASED_OUTPATIENT_CLINIC_OR_DEPARTMENT_OTHER): Payer: Self-pay

## 2017-07-11 ENCOUNTER — Ambulatory Visit: Payer: Self-pay | Admitting: *Deleted

## 2017-07-11 ENCOUNTER — Emergency Department (HOSPITAL_BASED_OUTPATIENT_CLINIC_OR_DEPARTMENT_OTHER)
Admission: EM | Admit: 2017-07-11 | Discharge: 2017-07-11 | Disposition: A | Discharge status: Home / Self Care | Payer: Self-pay | Attending: Emergency Medicine | Admitting: Emergency Medicine

## 2017-07-11 ENCOUNTER — Encounter (HOSPITAL_BASED_OUTPATIENT_CLINIC_OR_DEPARTMENT_OTHER): Payer: Self-pay

## 2017-07-11 DIAGNOSIS — F17210 Nicotine dependence, cigarettes, uncomplicated: Secondary | ICD-10-CM | POA: Insufficient documentation

## 2017-07-11 DIAGNOSIS — Z79899 Other long term (current) drug therapy: Secondary | ICD-10-CM | POA: Insufficient documentation

## 2017-07-11 DIAGNOSIS — R1032 Left lower quadrant pain: Secondary | ICD-10-CM

## 2017-07-11 DIAGNOSIS — K529 Noninfective gastroenteritis and colitis, unspecified: Secondary | ICD-10-CM

## 2017-07-11 DIAGNOSIS — K5289 Other specified noninfective gastroenteritis and colitis: Secondary | ICD-10-CM | POA: Insufficient documentation

## 2017-07-11 LAB — COMPREHENSIVE METABOLIC PANEL
ALK PHOS: 70 U/L (ref 38–126)
ALT: 14 U/L (ref 14–54)
ANION GAP: 12 (ref 5–15)
AST: 22 U/L (ref 15–41)
Albumin: 4.5 g/dL (ref 3.5–5.0)
BUN: 6 mg/dL (ref 6–20)
CALCIUM: 9.2 mg/dL (ref 8.9–10.3)
CO2: 23 mmol/L (ref 22–32)
Chloride: 103 mmol/L (ref 101–111)
Creatinine, Ser: 0.53 mg/dL (ref 0.44–1.00)
Glucose, Bld: 93 mg/dL (ref 65–99)
Potassium: 4.1 mmol/L (ref 3.5–5.1)
Sodium: 138 mmol/L (ref 135–145)
TOTAL PROTEIN: 7.8 g/dL (ref 6.5–8.1)
Total Bilirubin: 0.7 mg/dL (ref 0.3–1.2)

## 2017-07-11 LAB — URINALYSIS, ROUTINE W REFLEX MICROSCOPIC
BILIRUBIN URINE: NEGATIVE
Glucose, UA: NEGATIVE mg/dL
HGB URINE DIPSTICK: NEGATIVE
KETONES UR: NEGATIVE mg/dL
Leukocytes, UA: NEGATIVE
NITRITE: NEGATIVE
PH: 7 (ref 5.0–8.0)
Protein, ur: NEGATIVE mg/dL

## 2017-07-11 LAB — CBC
HCT: 43 % (ref 36.0–46.0)
Hemoglobin: 15 g/dL (ref 12.0–15.0)
MCH: 31.4 pg (ref 26.0–34.0)
MCHC: 34.9 g/dL (ref 30.0–36.0)
MCV: 90.1 fL (ref 78.0–100.0)
PLATELETS: 253 10*3/uL (ref 150–400)
RBC: 4.77 MIL/uL (ref 3.87–5.11)
RDW: 12.8 % (ref 11.5–15.5)
WBC: 10.8 10*3/uL — AB (ref 4.0–10.5)

## 2017-07-11 LAB — LIPASE, BLOOD: Lipase: 32 U/L (ref 11–51)

## 2017-07-11 MED ORDER — MORPHINE SULFATE (PF) 4 MG/ML IV SOLN
4.0000 mg | Freq: Once | INTRAVENOUS | Status: AC
  Administered 2017-07-11: 4 mg via INTRAVENOUS
  Filled 2017-07-11: qty 1

## 2017-07-11 MED ORDER — ONDANSETRON HCL 4 MG/2ML IJ SOLN
4.0000 mg | Freq: Once | INTRAMUSCULAR | Status: AC
  Administered 2017-07-11: 4 mg via INTRAVENOUS
  Filled 2017-07-11: qty 2

## 2017-07-11 MED ORDER — ONDANSETRON HCL 4 MG PO TABS: 4.0000 mg | ORAL_TABLET | Freq: Three times a day (TID) | ORAL | 0 refills | Status: DC | PRN

## 2017-07-11 MED ORDER — METRONIDAZOLE 500 MG PO TABS: 500.0000 mg | ORAL_TABLET | Freq: Three times a day (TID) | ORAL | 0 refills | Status: AC

## 2017-07-11 MED ORDER — MORPHINE SULFATE (PF) 4 MG/ML IV SOLN
4.0000 mg | Freq: Once | INTRAVENOUS | Status: AC
Start: 2017-07-11 — End: 2017-07-11
  Administered 2017-07-11: 4 mg via INTRAVENOUS
  Filled 2017-07-11: qty 1

## 2017-07-11 MED ORDER — HYDROCODONE-ACETAMINOPHEN 5-325 MG PO TABS: 1.0000 | ORAL_TABLET | Freq: Four times a day (QID) | ORAL | 0 refills | Status: AC | PRN

## 2017-07-11 MED ORDER — CIPROFLOXACIN HCL 500 MG PO TABS: 500.0000 mg | ORAL_TABLET | Freq: Two times a day (BID) | ORAL | 0 refills | Status: AC

## 2017-07-11 MED ORDER — SODIUM CHLORIDE 0.9 % IV BOLUS
1000.0000 mL | Freq: Once | INTRAVENOUS | Status: AC
  Administered 2017-07-11: 1000 mL via INTRAVENOUS

## 2017-07-11 NOTE — ED Notes (Signed)
Unable to obtain IV access. Patient is already screaming before needle is inserted.  I tried twice with no success.

## 2017-07-11 NOTE — Telephone Encounter (Signed)
Pt has abd pain that started on Saturday with cramps, diarrhea, dizziness and headache. The pain started at her belly button and has gone down to her left side. She has had her appendix removed and also had a hysterectomy.  She has a dull pain between her shoulder blades.  C/o being cold. No fever that she can tell. Ad[facility] to be assessed Will notify flow at [facility].   Reason for Disposition . [1] SEVERE pain (e.g., excruciating) AND [2] present > 1 hour  Answer Assessment - Initial Assessment Questions 1. LOCATION: "Where does it hurt?"      At the belly button and now in the left lower abd 2. RADIATION: "Does the pain shoot anywhere else?" (e.g., chest, back)     Dull pain between her shoulder blades 3. ONSET: "When did the pain begin?" (e.g., minutes, hours or days ago)      Saturday 4. SUDDEN: "Gradual or sudden onset?"     gradual 5. PATTERN "Does the pain come and go, or is it constant?"    - If constant: "Is it getting better, staying the same, or worsening?"      (Note: Constant means the pain never goes away completely; most serious pain is constant and it progresses)     - If intermittent: "How long does it last?" "Do you have pain now?"     (Note: Intermittent means the pain goes away completely between bouts)     constant 6. SEVERITY: "How bad is the pain?"  (e.g., Scale 1-10; mild, moderate, or severe)   - MILD (1-3): doesn't interfere with normal activities, abdomen soft and not tender to touch    - MODERATE (4-7): interferes with normal activities or awakens from sleep, tender to touch    - SEVERE (8-10): excruciating pain, doubled over, unable to do any normal activities      10 + 7. RECURRENT SYMPTOM: "Have you ever had this type of abdominal pain before?" If so, ask: "When was the last time?""      Not like this 8. CAUSE: "What do you think is causing the abdominal pain?"     Not sure 9.  RELIEVING/AGGRAVATING FACTORS: "What makes it better or worse?" (e.g., movement, antacids, bowel movement)     Nothing better or worse 10. OTHER SYMPTOMS: "Has there been any vomiting, diarrhea, constipation, or urine problems?"       Headache, dizzy, balance is off, diarrhea, pain between shoulder 11. PREGNANCY: "Is there any chance you are pregnant?" "When was your last menstrual period?"       no  Protocols used: ABDOMINAL PAIN - [facility]

## 2017-07-11 NOTE — ED Provider Notes (Signed)
[facility] [place] EMERGENCY DEPARTMENT Provider Note   CSN: [id number] Arrival date & time: 07/11/17  1127     History   Chief Complaint Chief Complaint  Patient presents with  . Abdominal Pain    HPI Kylie Munoz is a 42 y.o. female.  HPI Patient is a 42 year old female who presents with abdominal pain, nausea, diarrhea. Onset of symptoms was Saturday. She reports lots of diarrhea and nausea, fevers and chills. No vomiting. No bright red blood per rectum. She reports her abdominal pain reminds her of when she had appendicitis in 2016. Abdominal pain is intermittent, feels like "sharp contraction". No alleviating/aggravating factors.   Patient also reports she developed headache for the past 2 days, which is atypical for her. She describes it as coming and going. She denies any changes in vision, paresthesias or weakness.   Past Medical History:  Diagnosis Date  . Acute bacterial sinusitis 06/06/2014  . Anxiety   . Anxiety and depression   . Obesity     Patient Active Problem List   Diagnosis Date Noted  . Appendicitis, acute, with focal peritonitis s/p lap appendectomy 07/23/2015 07/23/2015  . Appendicitis 07/23/2015  . Obesity   . PCP NOTES >>> 12/06/2014  . Acute pansinusitis 01/28/2014  . Sore throat 01/28/2014  . Carpal tunnel syndrome of left wrist 09/25/2013  . Abnormal vaginal Pap smear 10/06/2011  . Family history of breast cancer in first degree relative 09/20/2011  . Anxiety and depression 12/08/2006    Past Surgical History:  Procedure Laterality Date  . ABDOMINAL HYSTERECTOMY  2004   has left ovary  . APPENDECTOMY    . CARPAL TUNNEL RELEASE Right 2009  . CESAREAN SECTION  2001  . DENTAL SURGERY  2006  . LAPAROSCOPIC APPENDECTOMY N/A 07/23/2015   Procedure: APPENDECTOMY LAPAROSCOPIC;  Surgeon: Karie Soda, MD;  Location: [facility];  Service: General;  Laterality: N/A;  . UNILATERAL SALPINGECTOMY Right 2004     OB History    Gravida  2   Para  2    Term      Preterm      AB      Living  2     SAB      TAB      Ectopic      Multiple      Live Births               Home Medications    Prior to Admission medications   Medication Sig Start Date End Date Taking? Authorizing Provider  ALPRAZolam [name]) 1 MG tablet Take 0.5 tablets (0.5 mg total) by mouth 4 (four) times daily as needed for anxiety or sleep. 09/12/16  Yes Paz, Nolon Rod, MD  HYDROcodone-acetaminophen (NORCO/VICODIN) 5-325 MG tablet Take 1 tablet by mouth every 6 (six) hours as needed. 01/13/16  Yes Copland, Gwenlyn Found, MD  ibuprofen (ADVIL,MOTRIN) 200 MG tablet You can take 2-3 tablets every 6 hours as needed for pain 07/24/15  Yes Sherrie George, PA-C  Multiple Vitamin (MULTIVITAMIN) tablet Take 1 tablet by mouth daily. [employer] [facility]   Yes [provider]  acetaminophen (TYLENOL) 325 MG tablet Take 2 tablets (650 mg total) by mouth every 6 (six) hours as needed for mild pain (or temp > 100). 07/24/15   Sherrie George, PA-C  amoxicillin-clavulanate (AUGMENTIN) 875-125 MG tablet Take 1 tablet by mouth 2 (two) times daily. 08/04/16   Donato Schultz, DO  ciprofloxacin (CIPRO) 500 MG tablet  Take 1 tablet (500 mg total) by mouth 2 (two) times daily for 7 days. 07/11/17 07/18/17  Wynelle Cleveland, MD  fluconazole (DIFLUCAN) 150 MG tablet Take 1 tablet (150 mg total) by mouth daily. May repeat in 3 days prn 08/04/16   Zola Button, Grayling Congress, DO  HYDROcodone-acetaminophen (NORCO/VICODIN) 5-325 MG tablet Take 1 tablet by mouth every 6 (six) hours as needed for up to 3 days. 07/11/17 07/14/17  Wynelle Cleveland, MD  metroNIDAZOLE (FLAGYL) 500 MG tablet Take 1 tablet (500 mg total) by mouth 3 (three) times daily for 7 days. 07/11/17 07/18/17  Wynelle Cleveland, MD  ondansetron (ZOFRAN) 4 MG tablet Take 1 tablet (4 mg total) by mouth every 8 (eight) hours as needed for nausea or vomiting. 07/11/17   Wynelle Cleveland, MD    Family History Family History  Problem  Relation Age of Onset  . Hypertension Mother   . Diabetes Mother   . Heart failure Mother   . Breast cancer Mother        2006 bilat mast; dx in late 61s and again in late 65s  . Lung cancer Mother   . Hypertension Father   . Diabetes Father   . Heart failure Father   . Breast cancer Maternal Grandmother        bilateral cancer; diagnosed in mid 30s and again in her 32s  . Hypertension Maternal Grandmother   . Diabetes Maternal Grandmother   . Hypertension Maternal Grandfather   . Breast cancer Paternal Grandmother 49  . Stroke Paternal Grandmother   . Hypertension Paternal Grandmother   . Diabetes Paternal Grandmother   . Heart failure Paternal Grandmother   . Hypertension Paternal Grandfather   . Ovarian cancer Paternal Aunt        paternal grandmother's sister  . Ovarian cancer Maternal Aunt        maternal grandmother's sister    Social History Social History   Tobacco Use  . Smoking status: Current Some Day Smoker    Packs/day: 0.50    Types: Cigarettes  . Smokeless tobacco: Never Used  Substance Use Topics  . Alcohol use: Not Currently  . Drug use: No     Allergies   Iohexol and Tramadol hcl   Review of Systems Review of Systems  Constitutional: Negative for chills and fever.  HENT: Negative for ear pain and sore throat.   Eyes: Negative for pain and visual disturbance.  Respiratory: Negative for cough and shortness of breath.   Cardiovascular: Negative for chest pain and palpitations.  Gastrointestinal: Positive for abdominal pain, diarrhea and nausea. Negative for vomiting.  Genitourinary: Negative for dysuria and hematuria.  Musculoskeletal: Negative for arthralgias and back pain.  Skin: Negative for color change and rash.  Neurological: Positive for headaches. Negative for seizures and syncope.  All other systems reviewed and are negative.    Physical Exam Updated Vital Signs BP 111/65 (BP Location: Right Arm)   Pulse 64   Temp 97.9 F (36.6  C) (Oral)   Resp 18   Ht  (1.651 m)   Wt 77.1 kg (170 lb)   LMP 03/13/2002   SpO2 96%   BMI 28.29 kg/m   Physical Exam  Constitutional: She appears well-developed and well-nourished. She appears distressed.  HENT:  Head: Normocephalic and atraumatic.  Eyes: Conjunctivae are normal.  Neck: Neck supple.  Cardiovascular: Normal rate and regular rhythm.  No murmur heard. Pulmonary/Chest: Effort normal and breath sounds normal. No respiratory distress.  Abdominal: Soft.  There is tenderness (LLQ).  Musculoskeletal: She exhibits no edema.  Neurological: She is alert.  Skin: Skin is warm and dry.  Psychiatric: She has a normal mood and affect.  Nursing note and vitals reviewed.    ED Treatments / Results  Labs (all labs ordered are listed, but only abnormal results are displayed) Labs Reviewed  URINALYSIS, ROUTINE W REFLEX MICROSCOPIC - Abnormal; Notable for the following components:      Result Value   Color, Urine STRAW (*)    Specific Gravity, Urine <1.005 (*)    All other components within normal limits  CBC - Abnormal; Notable for the following components:   WBC 10.8 (*)    All other components within normal limits  LIPASE, BLOOD  COMPREHENSIVE METABOLIC PANEL    EKG None  Radiology Ct Abdomen Pelvis Wo Contrast  Result Date: 07/11/2017 CLINICAL DATA:  LEFT abdominal and flank pain, nausea, diarrhea for 4 days. History of appendectomy and hysterectomy. EXAM: CT ABDOMEN AND PELVIS WITHOUT CONTRAST TECHNIQUE: Multidetector CT imaging of the abdomen and pelvis was performed following the standard protocol without IV contrast. COMPARISON:  CT abdomen and pelvis Jul 23, 2015 FINDINGS: LOWER CHEST: Dependent atelectasis. The visualized heart size is normal. No pericardial effusion. HEPATOBILIARY: Normal. PANCREAS: Normal. SPLEEN: Normal. ADRENALS/URINARY TRACT: Kidneys are orthotopic, demonstrating normal size and morphology. No nephrolithiasis, hydronephrosis; limited  assessment for renal masses on this nonenhanced examination. The unopacified ureters are normal in course and caliber. Urinary bladder is partially distended and unremarkable. Normal adrenal glands. STOMACH/BOWEL: Trace residual versus refluxed contrast in distal esophagus. The stomach, small and large bowel are normal in course and caliber without inflammatory changes. Enteric contrast has not yet reached the distal small bowel. Moderate descending and sigmoid colonic diverticulosis. VASCULAR/LYMPHATIC: Aortoiliac vessels are normal in course and caliber. Mild calcific atherosclerosis. No lymphadenopathy by CT size criteria. REPRODUCTIVE: Status post hysterectomy. OTHER: No intraperitoneal free fluid or free air. MUSCULOSKELETAL: Non-acute.  Severe LEFT L5-S1 facet arthropathy. IMPRESSION: 1. Colonic diverticulosis without acute diverticulitis nor acute intra-abdominal/pelvic process. Aortic Atherosclerosis (ICD10-I70.0). Electronically Signed   By: Awilda Metro M.D.   On: 07/11/2017 18:45    Procedures Procedures (including critical care time)  Medications Ordered in ED Medications  sodium chloride 0.9 % bolus 1,000 mL (0 mLs Intravenous Stopped 07/11/17 1625)  morphine 4 MG/ML injection 4 mg (4 mg Intravenous Given 07/11/17 1511)  ondansetron (ZOFRAN) injection 4 mg (4 mg Intravenous Given 07/11/17 1511)  morphine 4 MG/ML injection 4 mg (4 mg Intravenous Given 07/11/17 1625)  ondansetron (ZOFRAN) injection 4 mg (4 mg Intravenous Given 07/11/17 1624)  morphine 4 MG/ML injection 4 mg (4 mg Intravenous Given 07/11/17 1722)     Initial Impression / Assessment and Plan / ED Course  I have reviewed the triage vital signs and the nursing notes.  Pertinent labs & imaging results that were available during my care of the patient were reviewed by me and considered in my medical decision making (see chart for details).     Patient is a 42 year old female who presents with abdominal pain, nausea,  diarrhea.  Patient arrived afebrile, hemodynamically stable, in mild distress.  Exam as above, significant for left lower quadrant abdominal tenderness to palpation.  No rebound or guarding.  Labs obtained, significant for leukocytosis to 10.8. Stable CMP, negative lipase. History of contrast allergy, so non-contrast scan ordered. Per my read, mild colitis LLQ. No evidence of perforation or abscess. Will treat for colitis. Discussed with patient  abdominal re-check in 24-48 hours. Return precautions discussed.    Patient and plan of care discussed with Attending physician, Dr. Ranae Palms.     Final Clinical Impressions(s) / ED Diagnoses   Final diagnoses:  Left lower quadrant pain  Colitis    ED Discharge Orders        Ordered    ciprofloxacin (CIPRO) 500 MG tablet  2 times daily     07/11/17 1921    metroNIDAZOLE (FLAGYL) 500 MG tablet  3 times daily     07/11/17 1921    ondansetron (ZOFRAN) 4 MG tablet  Every 8 hours PRN     07/11/17 1921    HYDROcodone-acetaminophen (NORCO/VICODIN) 5-325 MG tablet  Every 6 hours PRN     07/11/17 1921       Wynelle Cleveland, MD 07/11/17 Margretta Ditty    Loren Racer, MD 07/13/17 [phone]

## 2017-07-11 NOTE — ED Triage Notes (Addendum)
C/o abd pain (LLQ, left flank and lower pelvic) , n/d x 4 days-also c/o HA x 3 days-NAD-presents to triage in w/c-drove self to ED

## 2017-07-11 NOTE — ED Notes (Signed)
Blood draw X1 attempt in right hand was unsuccessful. Advised patient would try again once she gets a room in the back.

## 2017-07-11 NOTE — ED Notes (Signed)
ED Provider at bedside. 

## 2017-07-11 NOTE — Telephone Encounter (Signed)
Agree with ER evaluation

## 2017-10-25 ENCOUNTER — Emergency Department (HOSPITAL_BASED_OUTPATIENT_CLINIC_OR_DEPARTMENT_OTHER): Payer: Self-pay

## 2017-10-25 ENCOUNTER — Encounter (HOSPITAL_BASED_OUTPATIENT_CLINIC_OR_DEPARTMENT_OTHER): Payer: Self-pay | Admitting: Emergency Medicine

## 2017-10-25 ENCOUNTER — Other Ambulatory Visit: Payer: Self-pay

## 2017-10-25 ENCOUNTER — Emergency Department (HOSPITAL_BASED_OUTPATIENT_CLINIC_OR_DEPARTMENT_OTHER)
Admission: EM | Admit: 2017-10-25 | Discharge: 2017-10-25 | Disposition: A | Discharge status: Home / Self Care | Payer: Self-pay | Attending: Emergency Medicine | Admitting: Emergency Medicine

## 2017-10-25 DIAGNOSIS — F17210 Nicotine dependence, cigarettes, uncomplicated: Secondary | ICD-10-CM | POA: Insufficient documentation

## 2017-10-25 DIAGNOSIS — R9431 Abnormal electrocardiogram [ECG] [EKG]: Secondary | ICD-10-CM | POA: Diagnosis not present

## 2017-10-25 DIAGNOSIS — R0789 Other chest pain: Secondary | ICD-10-CM | POA: Diagnosis not present

## 2017-10-25 DIAGNOSIS — R079 Chest pain, unspecified: Secondary | ICD-10-CM | POA: Diagnosis not present

## 2017-10-25 DIAGNOSIS — R42 Dizziness and giddiness: Secondary | ICD-10-CM | POA: Diagnosis not present

## 2017-10-25 DIAGNOSIS — Z79899 Other long term (current) drug therapy: Secondary | ICD-10-CM | POA: Insufficient documentation

## 2017-10-25 LAB — CBC
HCT: 45 % (ref 36.0–46.0)
HEMOGLOBIN: 15.4 g/dL — AB (ref 12.0–15.0)
MCH: 31.5 pg (ref 26.0–34.0)
MCHC: 34.2 g/dL (ref 30.0–36.0)
MCV: 92 fL (ref 78.0–100.0)
PLATELETS: 252 10*3/uL (ref 150–400)
RBC: 4.89 MIL/uL (ref 3.87–5.11)
RDW: 13.1 % (ref 11.5–15.5)
WBC: 9.3 10*3/uL (ref 4.0–10.5)

## 2017-10-25 LAB — BASIC METABOLIC PANEL
ANION GAP: 10 (ref 5–15)
BUN: 6 mg/dL (ref 6–20)
CALCIUM: 9 mg/dL (ref 8.9–10.3)
CO2: 26 mmol/L (ref 22–32)
CREATININE: 0.56 mg/dL (ref 0.44–1.00)
Chloride: 104 mmol/L (ref 98–111)
GLUCOSE: 105 mg/dL — AB (ref 70–99)
Potassium: 3.6 mmol/L (ref 3.5–5.1)
Sodium: 140 mmol/L (ref 135–145)

## 2017-10-25 LAB — TROPONIN I

## 2017-10-25 NOTE — Discharge Instructions (Signed)
Take 4 over the counter ibuprofen tablets 3 times a day or 2 over-the-counter naproxen tablets twice a day for pain. Also take tylenol 1000mg (2 extra strength) four times a day.    Follow up with your PCP in the office.  With your family history they may elect to send you to a cardiologist.

## 2017-10-25 NOTE — ED Notes (Signed)
ED Provider at bedside discussing test results and plan of care. 

## 2017-10-25 NOTE — ED Notes (Signed)
ED Provider at bedside. 

## 2017-10-25 NOTE — ED Provider Notes (Addendum)
[facility] EMERGENCY DEPARTMENT Provider Note   CSN: [id number] Arrival date & time: 10/25/17  1002     History   Chief Complaint Chief Complaint  Patient presents with  . Chest Pain    HPI Kylie Munoz is a 42 y.o. female.  42 yo F with a chief complaint of chest pain.  Is described as a squeezing localized to her center of her chest.  Pinpoint area.  Initially denied radiation but then said when he got really bad she felt like it went into bilateral shoulders.  Seems to come and go.  Nothing makes it better or worse.  Denies diaphoresis denies nausea or vomiting denies shortness of breath.  Occurred while she was at work.  No significant exertion.  Nothing seems to make this better or worse.  Has been going on for the past 3 or 4 days.  Denies cough congestion or fever.  Denies hemoptysis denies lower extremity edema denies estrogen use denies recent hospitalization or surgery denies prolonged immobilization.  Denies history of PE or DVT.  Denies history of MI.  Denies history of hypertension hyperlipidemia or diabetes.  Her father had a heart attack in his late 1620s.  Denies other family members with MI in their 2420s.  She is a current smoker.  The history is provided by the patient.  Chest Pain   This is a new problem. The current episode started more than 1 week ago. The problem occurs constantly. The problem has been gradually worsening. Associated with: unknown. The pain is present in the substernal region. The pain is at a severity of 8/10. The pain is moderate. The quality of the pain is described as brief. The pain radiates to the left shoulder and right shoulder. Duration of episode(s) is 1 week. Pertinent negatives include no dizziness, no fever, no headaches, no nausea, no palpitations, no shortness of breath and no vomiting. She has tried nothing for the symptoms. The treatment provided no relief. Risk factors include smoking/tobacco exposure.  Pertinent negatives for  past medical history include no diabetes, no DVT, no hyperlipidemia, no hypertension, no MI and no PE.  Her family medical history is significant for early MI.    Past Medical History:  Diagnosis Date  . Acute bacterial sinusitis 06/06/2014  . Anxiety   . Anxiety and depression   . Obesity     Patient Active Problem List   Diagnosis Date Noted  . Appendicitis, acute, with focal peritonitis s/p lap appendectomy 07/23/2015 07/23/2015  . Appendicitis 07/23/2015  . Obesity   . PCP NOTES >>> 12/06/2014  . Acute pansinusitis 01/28/2014  . Sore throat 01/28/2014  . Carpal tunnel syndrome of left wrist 09/25/2013  . Abnormal vaginal Pap smear 10/06/2011  . Family history of breast cancer in first degree relative 09/20/2011  . Anxiety and depression 12/08/2006    Past Surgical History:  Procedure Laterality Date  . ABDOMINAL HYSTERECTOMY  2004   has left ovary  . APPENDECTOMY    . CARPAL TUNNEL RELEASE Right 2009  . CESAREAN SECTION  2001  . DENTAL SURGERY  2006  . LAPAROSCOPIC APPENDECTOMY N/A 07/23/2015   Procedure: APPENDECTOMY LAPAROSCOPIC;  Surgeon: Karie SodaSteven Gross, MD;  Location: [facility]S;  Service: General;  Laterality: N/A;  . UNILATERAL SALPINGECTOMY Right 2004     OB History    Gravida  2   Para  2   Term      Preterm      AB  Living  2     SAB      TAB      Ectopic      Multiple      Live Births               Home Medications    Prior to Admission medications   Medication Sig Start Date End Date Taking? Authorizing Provider  acetaminophen (TYLENOL) 325 MG tablet Take 2 tablets (650 mg total) by mouth every 6 (six) hours as needed for mild pain (or temp > 100). 07/24/15   Sherrie George, PA-C  ALPRAZolam [name]) 1 MG tablet Take 0.5 tablets (0.5 mg total) by mouth 4 (four) times daily as needed for anxiety or sleep. 09/12/16   Wanda Plump, MD  amoxicillin-clavulanate (AUGMENTIN) 875-125 MG tablet Take 1 tablet by mouth 2 (two) times daily.  08/04/16   Donato Schultz, DO  fluconazole (DIFLUCAN) 150 MG tablet Take 1 tablet (150 mg total) by mouth daily. May repeat in 3 days prn 08/04/16   Zola Button, Grayling Congress, DO  HYDROcodone-acetaminophen (NORCO/VICODIN) 5-325 MG tablet Take 1 tablet by mouth every 6 (six) hours as needed. 01/13/16   Copland, Gwenlyn Found, MD  ibuprofen (ADVIL,MOTRIN) 200 MG tablet You can take 2-3 tablets every 6 hours as needed for pain 07/24/15   Sherrie George, PA-C  Multiple Vitamin (MULTIVITAMIN) tablet Take 1 tablet by mouth daily. [facility]    [provider]  ondansetron (ZOFRAN) 4 MG tablet Take 1 tablet (4 mg total) by mouth every 8 (eight) hours as needed for nausea or vomiting. 07/11/17   Wynelle Cleveland, MD    Family History Family History  Problem Relation Age of Onset  . Hypertension Mother   . Diabetes Mother   . Heart failure Mother   . Breast cancer Mother        2006 bilat mast; dx in late 14s and again in late 15s  . Lung cancer Mother   . Hypertension Father   . Diabetes Father   . Heart failure Father   . Breast cancer Maternal Grandmother        bilateral cancer; diagnosed in mid 30s and again in her 36s  . Hypertension Maternal Grandmother   . Diabetes Maternal Grandmother   . Hypertension Maternal Grandfather   . Breast cancer Paternal Grandmother 2  . Stroke Paternal Grandmother   . Hypertension Paternal Grandmother   . Diabetes Paternal Grandmother   . Heart failure Paternal Grandmother   . Hypertension Paternal Grandfather   . Ovarian cancer Paternal Aunt        paternal grandmother's sister  . Ovarian cancer Maternal Aunt        maternal grandmother's sister    Social History Social History   Tobacco Use  . Smoking status: Current Some Day Smoker    Packs/day: 0.50    Types: Cigarettes  . Smokeless tobacco: Never Used  Substance Use Topics  . Alcohol use: Not Currently  . Drug use: No     Allergies   Iohexol and Tramadol  hcl   Review of Systems Review of Systems  Constitutional: Negative for chills and fever.  HENT: Negative for congestion and rhinorrhea.   Eyes: Negative for redness and visual disturbance.  Respiratory: Positive for chest tightness. Negative for shortness of breath and wheezing.   Cardiovascular: Positive for chest pain. Negative for palpitations.  Gastrointestinal: Negative for nausea and vomiting.  Genitourinary: Negative for dysuria and urgency.  Musculoskeletal: Negative for arthralgias and myalgias.  Skin: Negative for pallor and wound.  Neurological: Negative for dizziness and headaches.     Physical Exam Updated Vital Signs BP (!) 96/56 (BP Location: Left Arm)   Pulse 63   Temp 98.2 F (36.8 C) (Oral)   Resp 16   Ht 5\' 5"  (1.651 m)   Wt 79.4 kg   LMP 03/13/2002   SpO2 96%   BMI 29.12 kg/m   Physical Exam  Constitutional: She is oriented to person, place, and time. She appears well-developed and well-nourished. No distress.  HENT:  Head: Normocephalic and atraumatic.  Eyes: Pupils are equal, round, and reactive to light. EOM are normal.  Neck: Normal range of motion. Neck supple.  Cardiovascular: Normal rate and regular rhythm. Exam reveals no gallop and no friction rub.  No murmur heard. Pulmonary/Chest: Effort normal. She has no wheezes. She has no rales. She exhibits tenderness (palpation about the sternum reproduces patients symptoms).  Abdominal: Soft. She exhibits no distension. There is no tenderness.  Musculoskeletal: She exhibits no edema or tenderness.  Neurological: She is alert and oriented to person, place, and time.  Skin: Skin is warm and dry. She is not diaphoretic.  Psychiatric: She has a normal mood and affect. Her behavior is normal.  Nursing note and vitals reviewed.    ED Treatments / Results  Labs (all labs ordered are listed, but only abnormal results are displayed) Labs Reviewed  BASIC METABOLIC PANEL - Abnormal; Notable for the  following components:      Result Value   Glucose, Bld 105 (*)    All other components within normal limits  CBC - Abnormal; Notable for the following components:   Hemoglobin 15.4 (*)    All other components within normal limits  TROPONIN I    EKG EKG Interpretation  Date/Time:  Wednesday October 25 2017 10:13:27 EDT Ventricular Rate:  64 PR Interval:    QRS Duration: 101 QT Interval:  411 QTC Calculation: 424 R Axis:   70 Text Interpretation:  Sinus rhythm downward sloping st segment in lead III, similar to prior Otherwise no significant change Confirmed by Melene Plan [phone]) on 10/25/2017 11:09:02 AM   Radiology Dg Chest 2 View  Result Date: 10/25/2017 CLINICAL DATA:  Chest pain and pressure sensation for the past 3 days. Current smoker. EXAM: CHEST - 2 VIEW COMPARISON:  PA and lateral chest x-ray of February 26, 2015. FINDINGS: The lungs are well-expanded. The interstitial markings are mildly prominent though stable. The heart and pulmonary vascularity are normal. The mediastinum is normal in width. The trachea is midline. The bony thorax exhibits no acute abnormality. IMPRESSION: Mild chronic bronchitic-smoking related changes. No pneumonia, CHF, nor other acute cardiopulmonary abnormality. Electronically Signed   By: David  [place] M.D.   On: 10/25/2017 10:53    Procedures Procedures (including critical care time) Discussed smoking cessation with patient and was they were offerred resources to help stop.  Total time was 5 min CPT code 60454.   Medications Ordered in ED Medications - No data to display   Initial Impression / Assessment and Plan / ED Course  I have reviewed the triage vital signs and the nursing notes.  Pertinent labs & imaging results that were available during my care of the patient were reviewed by me and considered in my medical decision making (see chart for details).     42 yo F with a chief complaint of chest pain.  This is atypical in nature  and  reproduced on palpation.  Most likely this is muscular skeletal.  Patient has a stated family history of an MI 1 of her father in his 62s.  Therefore will obtain a delta troponin.  I discussed the patient's results after the initial round of labs.  Patient is reluctant to stay.  I discussed risks and benefits and the patient understands that she has a little bit over a 2% risk currently.  She feels comfortable with that amount of risk.  She will follow-up with her family physician in the office.  I offered for her to return at any time for further evaluation.  She was PERC negative.   12:46 PM:  I have discussed the diagnosis/risks/treatment options with the patient and believe the pt to be eligible for discharge home to follow-up with PCP. We also discussed returning to the ED immediately if new or worsening sx occur. We discussed the sx which are most concerning (e.g., sudden worsening pain, fever, inability to tolerate by mouth) that necessitate immediate return. Medications administered to the patient during their visit and any new prescriptions provided to the patient are listed below.  Medications given during this visit Medications - No data to display    The patient appears reasonably screen and/or stabilized for discharge and I doubt any other medical condition or other [facility] requiring further screening, evaluation, or treatment in the ED at this time prior to discharge.    Final Clinical Impressions(s) / ED Diagnoses   Final diagnoses:  Atypical chest pain    ED Discharge Orders    None       Melene Plan, DO 10/25/17 1246    Melene Plan, DO 10/26/17 1610

## 2017-10-25 NOTE — ED Triage Notes (Signed)
Patient with c/o intermittent mid chest pain since Monday, worse today.  Describes this as a heaviness.  C/o nausea, shortness of breath.  EMS called to workplace today.  Patient refused transport to ED.  Drove self to ED.

## 2018-04-27 ENCOUNTER — Encounter: Payer: Self-pay | Admitting: *Deleted

## 2018-04-27 ENCOUNTER — Ambulatory Visit: Payer: Self-pay | Admitting: *Deleted

## 2018-04-27 ENCOUNTER — Telehealth: Payer: Self-pay | Admitting: Internal Medicine

## 2018-04-27 NOTE — Telephone Encounter (Signed)
Noted  

## 2018-04-27 NOTE — Telephone Encounter (Signed)
Okay to cancel NP appt w/ Dr. Carmelia Roller on Monday and schedule visit w/ Dr. Drue Novel. Thank you.

## 2018-04-27 NOTE — Telephone Encounter (Signed)
Okay to take her as a new patient however I noticed that she was sent to the ER and has a follow-up with Dr. Carmelia Roller on Monday.

## 2018-04-27 NOTE — Telephone Encounter (Signed)
This encounter was created in error - please disregard.

## 2018-04-27 NOTE — Telephone Encounter (Signed)
Pt calling stating that while at work today she experienced a tightening/squeezing feeling in her chest while walking down the hall. Pt states that at that time she also experienced dizziness. Pt states that she has any dizziness or chest pain at this time and is currently driving to go home from work. Pt states her BP was taken at work and was noted to be 185/95. Pt states she does not have a history of cardiac issues or HTN but she does have a family history of cardiac disease. Pt advised to seek treatment in the ED. Pt verbalized understanding but states she does not know if she will go to the ED at this time stating that she would like to go home and recheck BP. Advised pt again to seek treatment in the ED for current symptoms. Pt has a new pt appt with Dr. Carmelia Roller on Monday,04/30/18 at 1 pm.   Reason for Disposition . [1] Systolic BP  >= 160 OR Diastolic >= 100 AND [2] cardiac or neurologic symptoms (e.g., chest pain, difficulty breathing, unsteady gait, blurred vision)  Answer Assessment - Initial Assessment Questions 1. LOCATION: "Where does it hurt?"       chest 2. ONSET: "When did the chest pain begin?" (Minutes, hours or days)      Pt states she experienced the chest pain today while at work 3. PATTERN "Does the pain come and go, or has it been constant since it started?"  "Does it get worse with exertion?"      Comes and goes and states it does not get worse with exertion 4. DURATION: "How long does it last" (e.g., seconds, minutes, hours)     A few minutes 5. CARDIAC RISK FACTORS: "Do you have any history of heart problems or risk factors for heart disease?" (e.g., prior heart attack, angina; high blood pressure, diabetes, being overweight, high cholesterol, smoking, or strong family history of heart disease)     Parents have a history of heart disease and pt states she is currently trying to quit smoking 6. PULMONARY RISK FACTORS: "Do you have any history of lung disease?"  (e.g., blood  clots in lung, asthma, emphysema, birth control pills)     No 7. OTHER SYMPTOMS: "Do you have any other symptoms?" (e.g., dizziness, nausea, vomiting, sweating, fever, difficulty breathing, cough)       Dizziness previously but denies at this time  Answer Assessment - Initial Assessment Questions 1. BLOOD PRESSURE: "What is the blood pressure?" "Did you take at least two measurements 5 minutes apart?"     185/95 2. ONSET: "When did you take your blood pressure?"     While at work 3. HOW: "How did you obtain the blood pressure?" (e.g., visiting nurse, automatic home BP monitor)     BP was taken while at work 4. HISTORY: "Do you have a history of high blood pressure?"     No 5. MEDICATIONS: "Are you taking any medications for blood pressure?" "Have you missed any doses recently?"     Pt states she is not on any medications for BP 6. OTHER SYMPTOMS: "Do you have any symptoms?" (e.g., headache, chest pain, blurred vision, difficulty breathing, weakness)     Chest tightness/squeezing  Protocols used: HIGH BLOOD PRESSURE-A-AH, CHEST PAIN-A-AH

## 2018-04-27 NOTE — Telephone Encounter (Signed)
Copied from CRM [id number]. Topic: General - Other >> Apr 27, 2018 11:20 AM Tamela Oddi wrote: Reason for CRM: Patient called to check to see if she could be re-established as a patient of Dr. Drue Novel.  Her last appt. With Dr. Drue Novel was 12/05/2014.  Please advise and  call patient back if approved.  CB# [phone]

## 2018-04-27 NOTE — Telephone Encounter (Signed)
Call to patient- she is at home- she states her symptoms are better and her BP is 122/75. Patient states she appreciates the concern and if she has changes she will follow recommendation to go to ED- let patient know that we were following up out of concern for her and the recommendation to go to ED.

## 2018-04-27 NOTE — Telephone Encounter (Signed)
Please advise 

## 2018-04-27 NOTE — Telephone Encounter (Signed)
  Reason for Disposition . Dizziness or lightheadedness  Answer Assessment - Initial Assessment Questions 1. LOCATION: "Where does it hurt?"       *No Answer* 2. RADIATION: "Does the pain go anywhere else?" (e.g., into neck, jaw, arms, back)     *No Answer* 3. ONSET: "When did the chest pain begin?" (Minutes, hours or days)      *No Answer* 4. PATTERN "Does the pain come and go, or has it been constant since it started?"  "Does it get worse with exertion?"       5. DURATION: "How long does it last" (e.g., seconds, minutes, hours)     A few moments 6. SEVERITY: "How bad is the pain?"  (e.g., Scale 1-10; mild, moderate, or severe)    - MILD (1-3): doesn't interfere with normal activities     - MODERATE (4-7): interferes with normal activities or awakens from sleep    - SEVERE (8-10): excruciating pain, unable to do any normal activities       *No Answer* 7. CARDIAC RISK FACTORS: "Do you have any history of heart problems or risk factors for heart disease?" (e.g., prior heart attack, angina; high blood pressure, diabetes, being overweight, high cholesterol, smoking, or strong family history of heart disease)     Been trying to quit 8. PULMONARY RISK FACTORS: "Do you have any history of lung disease?"  (e.g., blood clots in lung, asthma, emphysema, birth control pills)     *No Answer* 9. CAUSE: "What do you think is causing the chest pain?"     *No Answer* 10. OTHER SYMPTOMS: "Do you have any other symptoms?" (e.g., dizziness, nausea, vomiting, sweating, fever, difficulty breathing, cough)       *No Answer* 11. PREGNANCY: "Is there any chance you are pregnant?" "When was your last menstrual period?"       *No Answer*  Protocols used: CHEST PAIN-A-AH

## 2018-04-27 NOTE — Telephone Encounter (Signed)
Please check on Pt- Dr. Drue Novel agrees, she should go to local ED. Is she planning to go ?

## 2018-04-30 ENCOUNTER — Other Ambulatory Visit: Payer: Self-pay

## 2018-04-30 ENCOUNTER — Ambulatory Visit: Payer: Self-pay | Admitting: Family Medicine

## 2018-04-30 ENCOUNTER — Emergency Department (HOSPITAL_BASED_OUTPATIENT_CLINIC_OR_DEPARTMENT_OTHER): Payer: BLUE CROSS/BLUE SHIELD

## 2018-04-30 ENCOUNTER — Encounter (HOSPITAL_BASED_OUTPATIENT_CLINIC_OR_DEPARTMENT_OTHER): Payer: Self-pay | Admitting: Emergency Medicine

## 2018-04-30 ENCOUNTER — Emergency Department (HOSPITAL_BASED_OUTPATIENT_CLINIC_OR_DEPARTMENT_OTHER)
Admission: EM | Admit: 2018-04-30 | Discharge: 2018-04-30 | Disposition: A | Discharge status: Home / Self Care | Payer: BLUE CROSS/BLUE SHIELD | Attending: Emergency Medicine | Admitting: Emergency Medicine

## 2018-04-30 DIAGNOSIS — Z5321 Procedure and treatment not carried out due to patient leaving prior to being seen by health care provider: Secondary | ICD-10-CM | POA: Diagnosis not present

## 2018-04-30 DIAGNOSIS — R079 Chest pain, unspecified: Secondary | ICD-10-CM | POA: Insufficient documentation

## 2018-04-30 MED ORDER — SODIUM CHLORIDE 0.9% FLUSH
3.0000 mL | Freq: Once | INTRAVENOUS | Status: DC
  Filled 2018-04-30: qty 3

## 2018-04-30 NOTE — ED Notes (Signed)
No answer when called for blood draw 

## 2018-04-30 NOTE — ED Triage Notes (Signed)
Reports chest pain since Friday; worsening today.  C/o shortness of breath and nausea.  Additionally c/o left arm pain.

## 2018-04-30 NOTE — Telephone Encounter (Signed)
Noted  

## 2018-04-30 NOTE — ED Notes (Signed)
Pt did not answer x 3

## 2018-05-04 ENCOUNTER — Ambulatory Visit (INDEPENDENT_AMBULATORY_CARE_PROVIDER_SITE_OTHER): Payer: BLUE CROSS/BLUE SHIELD | Admitting: Internal Medicine

## 2018-05-04 ENCOUNTER — Encounter: Payer: Self-pay | Admitting: Internal Medicine

## 2018-05-04 VITALS — BP 122/68 | HR 76 | Temp 98.1°F | Resp 16 | Ht 66.0 in | Wt 217.2 lb

## 2018-05-04 DIAGNOSIS — R739 Hyperglycemia, unspecified: Secondary | ICD-10-CM

## 2018-05-04 DIAGNOSIS — J069 Acute upper respiratory infection, unspecified: Secondary | ICD-10-CM | POA: Diagnosis not present

## 2018-05-04 DIAGNOSIS — F329 Major depressive disorder, single episode, unspecified: Secondary | ICD-10-CM

## 2018-05-04 DIAGNOSIS — R079 Chest pain, unspecified: Secondary | ICD-10-CM

## 2018-05-04 DIAGNOSIS — F419 Anxiety disorder, unspecified: Secondary | ICD-10-CM | POA: Diagnosis not present

## 2018-05-04 MED ORDER — DULOXETINE HCL 30 MG PO CPEP: 30.0000 mg | ORAL_CAPSULE | Freq: Every day | ORAL | 1 refills | Status: DC

## 2018-05-04 NOTE — Progress Notes (Signed)
Subjective:    Patient ID: Kylie Munoz, female    DOB: 05-12-1975, 43 y.o.   MRN: [id number]  DOS:  05/04/2018 Type of visit - description:  last seen by me 2016. Here with multiple concerns Reports anxiety for a while, typically work-related, she gets really intense and frustrated at work, she is a Investment banker, operational . Denies depression per se or suicidal ideas.  Also, 6 or 7 months history of chest pain, left upper chest, usually when she is at work and feeling frustrated. No associated palpitation, nausea, sweats.  Mild shortness of breath? In a couple of occasions she felt left arm numbness. No symptoms at rest.  Went to the ER 04/30/2018, EKG and chest x-ray were okay, she had a family  emergency and had to leave before seen by the MD.  Also, developed upper respiratory symptoms 5 days ago.  Sinus and ear congestion.  Still smoking.  Review of Systems  Denies fever chills No lower extremity edema No cough, no chest congestion.  Past Medical History:  Diagnosis Date  . Anxiety   . Anxiety and depression   . Obesity     Past Surgical History:  Procedure Laterality Date  . ABDOMINAL HYSTERECTOMY  2004   has left ovary  . CARPAL TUNNEL RELEASE Right 2009  . CESAREAN SECTION  2001  . DENTAL SURGERY  2006  . LAPAROSCOPIC APPENDECTOMY N/A 07/23/2015   Procedure: APPENDECTOMY LAPAROSCOPIC;  Surgeon: Karie Soda, MD;  Location: [facility];  Service: General;  Laterality: N/A;  . UNILATERAL SALPINGECTOMY Right 2004    Social History   Socioeconomic History  . Marital status: Divorced    Spouse name: Not on file  . Number of children: 2  . Years of education: Not on file  . Highest education level: Not on file  Occupational History  . Occupation: Retail buyer  Social Needs  . Financial resource strain: Not on file  . Food insecurity:    Worry: Not on file    Inability: Not on file  . Transportation needs:    Medical: Not on file    Non-medical: Not on file  Tobacco Use  .  Smoking status: Current Some Day Smoker    Packs/day: 0.50    Types: Cigarettes  . Smokeless tobacco: Never Used  . Tobacco comment: < 1/2 ppd  Substance and Sexual Activity  . Alcohol use: Not Currently  . Drug use: No  . Sexual activity: Not on file  Lifestyle  . Physical activity:    Days per week: Not on file    Minutes per session: Not on file  . Stress: Not on file  Relationships  . Social connections:    Talks on phone: Not on file    Gets together: Not on file    Attends religious service: Not on file    Active member of club or organization: Not on file    Attends meetings of clubs or organizations: Not on file    Relationship status: Not on file  . Intimate partner violence:    Fear of current or ex partner: Not on file    Emotionally abused: Not on file    Physically abused: Not on file    Forced sexual activity: Not on file  Other Topics Concern  . Not on file  Social History Narrative   Single mom    House   Boy [phone], married live w/ Zayden   Boy- Elijah Birk 2001   Had a step  daughter , does not see her       Allergies as of 05/04/2018      Reactions   Iohexol     Code: HIVES, Desc: PT had a near syncope event,developed hives & difficulty swallowing post injection of 125 ml Omni 300.Treated by Dr.McAlhany with 50 mg PO, 25mg  IV benedryl, 125 mg solu-cortef, 0.3 mg Epinephrine IM, 40 mg famotidine PO. Transferred to [facility] ED via EMS, Onset Date: [id number]   Tramadol Hcl    REACTION: itching      Medication List       Accurate as of May 04, 2018 11:59 PM. Always use your most recent med list.        DULoxetine 30 MG capsule Commonly known as:  CYMBALTA Take 1 capsule (30 mg total) by mouth daily.   FISH OIL PO Take by mouth.   ibuprofen 200 MG tablet Commonly known as:  ADVIL,MOTRIN You can take 2-3 tablets every 6 hours as needed for pain   VITAMIN C PO Take by mouth.   WOMENS ONE DAILY PO Take by mouth.           Objective:   Physical  Exam BP 122/68 (BP Location: Left Arm, Patient Position: Sitting, Cuff Size: Normal)   Pulse 76   Temp 98.1 F (36.7 C) (Oral)   Resp 16   Ht 5\' 6"  (1.676 m)   Wt 217 lb 4 oz (98.5 kg)   LMP 03/13/2002   SpO2 96%   BMI 35.07 kg/m  General:   Well developed, NAD, BMI noted. HEENT:  Normocephalic . Face symmetric, atraumatic. Left TM slightly bulged, no red.  Right TM normal.  Nose congested. Sinuses: Mildly TTP throughout. Throat: No white patches. Lungs:  Few rhonchi with cough Normal respiratory effort, no intercostal retractions, no accessory muscle use. Heart: RRR,  no murmur.  No pretibial edema bilaterally  Skin: Not pale. Not jaundice Neurologic:  alert & oriented X3.  Speech normal, gait appropriate for age and unassisted Psych--  Cognition and judgment appear intact.  Cooperative with normal attention span and concentration.  Behavior appropriate. Very emotional and tearful at times.     Assessment    ASSESSMENT   Anxiety and depression: years ago tried wellbutrin ("didn't feel right"), also SSRIs (didn't work for her) H/o abnormal Pap smears +FH Lung ca (mother)   Plan:  Chest pain: As described above, she is a smoker, his father had CAD at early age, she is overweight.  Recent EKG and chest x-ray okay. Plan: CMP, FLP, CBC.  Cardiology referral.  ER if symptoms severe. Anxiety and depression: I used to see the patient few years ago, see chart.  She has on and off issues with anxiety more than depression.  Today she is very emotional and tearful and that was the case in the past when I used to see her. No depression per se. She requests to go back on Xanax, I advised patient that that is not a good alternative, needs to try SSRIs, SNRIs and counseling. She was somewhat reluctant to take qd meds,   afraid of weight gain.  In the past she reports tried Lexapro & Prozac and does not wish to go back on those medicines. Recommend Cymbalta, less chance to gain  weight.  Also counseling and increase her physical activity.  Reassess in few weeks. URI: Patient request antibiotics, I encouraged her to do supportive treatment first.  Call if not better.  See AVS. RTC 4 weeks

## 2018-05-04 NOTE — Patient Instructions (Addendum)
GO TO THE LAB : Get the blood work     GO TO THE FRONT DESK Schedule your next appointment  For a check up in 4 weeks   === For cough:  Take Mucinex DM twice a day as needed until better  For nasal congestion: Use OTC Nasocort or Flonase : 2 nasal sprays on each side of the nose in the morning until you feel better  Call if not gradually better over the next  10 days  Call anytime if the symptoms are severe  == Start cymbalta 30 mg a day , will take time to work  Consider see a counselor  Exercise 3 hours a week  ===  If you have severe chest pain: go to the ER

## 2018-05-04 NOTE — Progress Notes (Signed)
Pre visit review using our clinic review tool, if applicable. No additional management support is needed unless otherwise documented below in the visit note. 

## 2018-05-05 LAB — COMPREHENSIVE METABOLIC PANEL
AG Ratio: 1.9 (calc) (ref 1.0–2.5)
ALT: 13 U/L (ref 6–29)
AST: 14 U/L (ref 10–30)
Albumin: 4.5 g/dL (ref 3.6–5.1)
Alkaline phosphatase (APISO): 74 U/L (ref 31–125)
BILIRUBIN TOTAL: 0.6 mg/dL (ref 0.2–1.2)
BUN: 8 mg/dL (ref 7–25)
CALCIUM: 9.3 mg/dL (ref 8.6–10.2)
CO2: 23 mmol/L (ref 20–32)
Chloride: 103 mmol/L (ref 98–110)
Creat: 0.54 mg/dL (ref 0.50–1.10)
Globulin: 2.4 g/dL (calc) (ref 1.9–3.7)
Glucose, Bld: 88 mg/dL (ref 65–99)
Potassium: 3.6 mmol/L (ref 3.5–5.3)
Sodium: 138 mmol/L (ref 135–146)
Total Protein: 6.9 g/dL (ref 6.1–8.1)

## 2018-05-05 LAB — CBC WITH DIFFERENTIAL/PLATELET
ABSOLUTE MONOCYTES: 495 {cells}/uL (ref 200–950)
BASOS ABS: 58 {cells}/uL (ref 0–200)
Basophils Relative: 0.6 %
EOS PCT: 3.2 %
Eosinophils Absolute: 310 cells/uL (ref 15–500)
HCT: 42.4 % (ref 35.0–45.0)
HEMOGLOBIN: 14.8 g/dL (ref 11.7–15.5)
LYMPHS ABS: 3608 {cells}/uL (ref 850–3900)
MCH: 32 pg (ref 27.0–33.0)
MCHC: 34.9 g/dL (ref 32.0–36.0)
MCV: 91.6 fL (ref 80.0–100.0)
MPV: 10.8 fL (ref 7.5–12.5)
Monocytes Relative: 5.1 %
NEUTROS ABS: 5228 {cells}/uL (ref 1500–7800)
NEUTROS PCT: 53.9 %
Platelets: 255 10*3/uL (ref 140–400)
RBC: 4.63 10*6/uL (ref 3.80–5.10)
RDW: 12.3 % (ref 11.0–15.0)
Total Lymphocyte: 37.2 %
WBC: 9.7 10*3/uL (ref 3.8–10.8)

## 2018-05-05 LAB — LIPID PANEL
Cholesterol: 262 mg/dL — ABNORMAL HIGH (ref <200)
HDL: 47 mg/dL — ABNORMAL LOW (ref >=50)
LDL Cholesterol (Calc): 182 mg/dL (calc) — ABNORMAL HIGH
Non-HDL Cholesterol (Calc): 215 mg/dL (calc) — ABNORMAL HIGH (ref <130)
Total CHOL/HDL Ratio: 5.6 (calc) — ABNORMAL HIGH (ref <5.0)
Triglycerides: 176 mg/dL — ABNORMAL HIGH (ref <150)

## 2018-05-05 LAB — HEMOGLOBIN A1C
Hgb A1c MFr Bld: 5.7 % of total Hgb — ABNORMAL HIGH (ref <5.7)
Mean Plasma Glucose: 117 (calc)
eAG (mmol/L): 6.5 (calc)

## 2018-05-06 NOTE — Assessment & Plan Note (Signed)
Chest pain: As described above, she is a smoker, his father had CAD at early age, she is overweight.  Recent EKG and chest x-ray okay. Plan: CMP, FLP, CBC.  Cardiology referral.  ER if symptoms severe. Anxiety and depression: I used to see the patient few years ago, see chart.  She has on and off issues with anxiety more than depression.  Today she is very emotional and tearful and that was the case in the past when I used to see her. No depression per se. She requests to go back on Xanax, I advised patient that that is not a good alternative, needs to try SSRIs, SNRIs and counseling. She was somewhat reluctant to take qd meds,   afraid of weight gain.  In the past she reports tried Lexapro & Prozac and does not wish to go back on those medicines. Recommend Cymbalta, less chance to gain weight.  Also counseling and increase her physical activity.  Reassess in few weeks. URI: Patient request antibiotics, I encouraged her to do supportive treatment first.  Call if not better.  See AVS. RTC 4 weeks

## 2018-05-08 ENCOUNTER — Telehealth: Payer: Self-pay

## 2018-05-08 MED ORDER — ATORVASTATIN CALCIUM 20 MG PO TABS: 20.0000 mg | ORAL_TABLET | Freq: Every day | ORAL | 2 refills | Status: DC

## 2018-05-08 NOTE — Telephone Encounter (Signed)
Spoke w/ Pt- informed of cholesterol numbers. Pt verbalized understanding.

## 2018-05-08 NOTE — Addendum Note (Signed)
Addended byConrad Ganado D on: 05/08/2018 01:55 PM   Modules accepted: Orders

## 2018-05-08 NOTE — Telephone Encounter (Signed)
Copied from CRM [id number]. Topic: General - Inquiry >> May 08, 2018  2:15 PM Baldo Daub L wrote: Reason for CRM:   Pt states she just spoke with someone about her cholesterol results, but she forgot what they were and wanted someone to call her back with the levels again. Pt can be reached at [phone].

## 2018-05-10 ENCOUNTER — Encounter: Payer: Self-pay | Admitting: Cardiology

## 2018-05-10 ENCOUNTER — Other Ambulatory Visit: Payer: Self-pay

## 2018-05-10 ENCOUNTER — Ambulatory Visit: Payer: Self-pay | Admitting: *Deleted

## 2018-05-10 ENCOUNTER — Ambulatory Visit (INDEPENDENT_AMBULATORY_CARE_PROVIDER_SITE_OTHER): Payer: BLUE CROSS/BLUE SHIELD | Admitting: Cardiology

## 2018-05-10 VITALS — BP 110/62 | HR 78 | Ht 66.0 in | Wt 218.4 lb

## 2018-05-10 DIAGNOSIS — R0789 Other chest pain: Secondary | ICD-10-CM | POA: Diagnosis not present

## 2018-05-10 DIAGNOSIS — E785 Hyperlipidemia, unspecified: Secondary | ICD-10-CM

## 2018-05-10 DIAGNOSIS — F329 Major depressive disorder, single episode, unspecified: Secondary | ICD-10-CM

## 2018-05-10 DIAGNOSIS — F419 Anxiety disorder, unspecified: Secondary | ICD-10-CM | POA: Diagnosis not present

## 2018-05-10 DIAGNOSIS — F17200 Nicotine dependence, unspecified, uncomplicated: Secondary | ICD-10-CM | POA: Insufficient documentation

## 2018-05-10 NOTE — Patient Instructions (Signed)
Medication Instructions:  Your physician recommends that you continue on your current medications as directed. Please refer to the Current Medication list given to you today.   If you need a refill on your cardiac medications before your next appointment, please call your pharmacy.   Lab work: None.  If you have labs (blood work) drawn today and your tests are completely normal, you will receive your results only by: Marland Kitchen MyChart Message (if you have MyChart) OR . A paper copy in the mail If you have any lab test that is abnormal or we need to change your treatment, we will call you to review the results.  Testing/Procedures: Your physician has requested that you have a stress echocardiogram. For further information please visit [email or web address] Please follow instruction sheet as given.    Follow-Up: At [facility], you and your health needs are our priority.  As part of our continuing mission to provide you with exceptional heart care, we have created designated Provider Care Teams.  These Care Teams include your primary Cardiologist (physician) and Advanced Practice Providers (APPs -  Physician Assistants and Nurse Practitioners) who all work together to provide you with the care you need, when you need it. You will need a follow up appointment in 1 months.  Please call our office 2 months in advance to schedule this appointment.  You may see Dr. Bing Matter or another member of our [facility] Provider Team in [place]: Norman Herrlich, MD . Belva Crome, MD  Any Other Special Instructions Will Be Listed Below (If Applicable).   Exercise Stress Echocardiogram  An exercise stress echocardiogram is a test to check how well your heart is working. This test uses sound waves (ultrasound) and a computer to make images of your heart before and after exercise. Ultrasound images that are taken before you exercise (your resting echocardiogram) will show how much blood is getting to your  heart muscle and how well your heart muscle and heart valves are functioning. During the next part of this test, you will walk on a treadmill or ride a stationary bike to see how exercise affects your heart. While you exercise, the electrical activity of your heart will be monitored with an electrocardiogram (ECG). Your blood pressure will also be monitored. You may have this test if you:  Have chest pain or other symptoms of a heart problem.  Recently had a heart attack or heart surgery.  Have heart valve problems.  Have a condition that causes narrowing of the blood vessels that supply your heart (coronary artery disease).  Have a high risk of heart disease and are starting a new exercise program.  Have a high risk of heart disease and need to have major surgery. Tell a health care provider about:  Any allergies you have.  All medicines you are taking, including vitamins, herbs, eye drops, creams, and over-the-counter medicines.  Any problems you or family members have had with anesthetic medicines.  Any blood disorders you have.  Any surgeries you have had.  Any medical conditions you have.  Whether you are pregnant or may be pregnant. What are the risks? Generally, this is a safe procedure. However, problems may occur, including:  Chest pain.  Dizziness or light-headedness.  Shortness of breath.  Increased or irregular heartbeat (palpitations).  Nausea or vomiting.  Heart attack (very rare). What happens before the procedure?  Follow instructions from your health care provider about eating or drinking restrictions. You may be asked to avoid all forms  of caffeine for 24 hours before your procedure, or as told by your health care provider.  Ask your health care provider about changing or stopping your regular medicines. This is especially important if you are taking diabetes medicines or blood thinners.  If you use an inhaler, bring it with you to the test.  Wear  loose, comfortable clothing and walking shoes.  Do notuse any products that contain nicotine or tobacco, such as cigarettes and e-cigarettes, for 4 hours before the test or as told by your health care provider. If you need help quitting, ask your health care provider. What happens during the procedure?  You will take off your clothes from the waist up and put on a hospital gown.  A technician will place electrodes on your chest.  A blood pressure cuff will be placed on your arm.  You will lie down on a table for an ultrasound exam before you exercise. Gel will be rubbed on your chest, and a handheld device (transducer) will be pressed against your chest and moved over your heart.  Then, you will start exercising by walking on a treadmill or pedaling a stationary bicycle.  Your blood pressure and heart rhythm will be monitored while you exercise.  The exercise will gradually get harder or faster.  You will exercise until: ? Your heart reaches a target level. ? You are too tired to continue. ? You cannot continue because of chest pain, weakness, or dizziness.  You will have another ultrasound exam after you stop exercising. The procedure may vary among health care providers and hospitals. What happens after the procedure?  Your heart rate and blood pressure will be monitored until they return to your normal levels. Summary  An exercise stress echocardiogram is a test that uses ultrasound to check how well your heart works before and after exercise.  Before the test, follow instructions from your health care provider about stopping medications, avoiding nicotine and tobacco, and avoiding certain foods and drinks.  During the test, your blood pressure and heart rhythm will be monitored while you exercise on a treadmill or stationary bicycle. This information is not intended to replace advice given to you by your health care provider. Make sure you discuss any questions you have with  your health care provider. Document Released: 02/19/2004 Document Revised: 10/07/2015 Document Reviewed: 10/07/2015 Elsevier Interactive Patient Education  2019 [employer].

## 2018-05-10 NOTE — Progress Notes (Signed)
Cardiology Consultation:    Date:  05/10/2018   ID:  Kylie Munoz, DOB Nov 16, 1975, MRN [id number]  PCP:  Wanda Plump, MD  Cardiologist:  Gypsy Balsam, MD   Referring MD: Wanda Plump, MD   Chief Complaint  Patient presents with  . Chest Pain  Have a chest pain  History of Present Illness:    Kylie Munoz is a 43 y.o. female who is being seen today for the evaluation of chest pain at the request of Wanda Plump, MD.  She was referred to me because of episode of chest pain.  She does have a very stressful job she is a Investment banker, operational in [employer] and she is very busy with this job is very demanding both mentally and physically.  She has been doing this for years she does not take any time off.  For last few months she has been experiencing chest pain pain is like a tightness lasting for up to a minute could happen at any time it could happen when she is it could happen when she try to do something.  There is no shortness of breath no sweating associated with a sensation no relieving or no aggravating factor the strength of the sensation is about 5.  She eventually not going to the emergency room she ruled out for myocardial infarction EKG did not show any acute changes she was discharged home.  She does not exercise on a regular basis she used to she actually lost quite significant amount of weight.  However now during the wintertime she does not exercise at all.  Described to have some anxiety and depression she tried all different medication with no results actually she suffered from significant side effect of this medication which include suicidal ideas.  She does not have any now.  She drinks about 3 times a week to the point that she is getting drunk.  She smokes about half pack per day of cigarettes.  She is divorced got 2 children one child does have her is on family already.  Past Medical History:  Diagnosis Date  . Anxiety   . Anxiety and depression   . Obesity     Past Surgical  History:  Procedure Laterality Date  . ABDOMINAL HYSTERECTOMY  2004   has left ovary  . CARPAL TUNNEL RELEASE Right 2009  . CESAREAN SECTION  2001  . DENTAL SURGERY  2006  . LAPAROSCOPIC APPENDECTOMY N/A 07/23/2015   Procedure: APPENDECTOMY LAPAROSCOPIC;  Surgeon: Karie Soda, MD;  Location: [facility];  Service: General;  Laterality: N/A;  . UNILATERAL SALPINGECTOMY Right 2004    Current Medications: Current Meds  Medication Sig  . Ascorbic Acid (VITAMIN C PO) Take by mouth.  Marland Kitchen atorvastatin (LIPITOR) 20 MG tablet Take 1 tablet (20 mg total) by mouth at bedtime.  Marland Kitchen ibuprofen (ADVIL,MOTRIN) 200 MG tablet You can take 2-3 tablets every 6 hours as needed for pain  . Multiple Vitamins-Minerals (WOMENS ONE DAILY PO) Take by mouth.  . Omega-3 Fatty Acids (FISH OIL PO) Take by mouth.  . Phenazopyridine HCl (AZO-STANDARD PO) Take by mouth daily.     Allergies:   Iohexol and Tramadol hcl   Social History   Socioeconomic History  . Marital status: Divorced    Spouse name: Not on file  . Number of children: 2  . Years of education: Not on file  . Highest education level: Not on file  Occupational History  . Occupation: Retail buyer  Social Needs  . Financial resource strain: Not on file  . Food insecurity:    Worry: Not on file    Inability: Not on file  . Transportation needs:    Medical: Not on file    Non-medical: Not on file  Tobacco Use  . Smoking status: Current Some Day Smoker    Packs/day: 0.50    Types: Cigarettes  . Smokeless tobacco: Never Used  . Tobacco comment: < 1/2 ppd  Substance and Sexual Activity  . Alcohol use: Not Currently  . Drug use: No  . Sexual activity: Not on file  Lifestyle  . Physical activity:    Days per week: Not on file    Minutes per session: Not on file  . Stress: Not on file  Relationships  . Social connections:    Talks on phone: Not on file    Gets together: Not on file    Attends religious service: Not on file    Active member  of club or organization: Not on file    Attends meetings of clubs or organizations: Not on file    Relationship status: Not on file  Other Topics Concern  . Not on file  Social History Narrative   Single mom    House   Boy [phone], married live w/ Deonna   Boy- Elijah Birk 2001   Had a step daughter , does not see her      Family History: The patient's family history includes Breast cancer in her maternal grandmother and mother; Breast cancer (age of onset: 90) in her paternal grandmother; CAD in her father; Diabetes in her father, maternal grandmother, mother, and paternal grandmother; Heart failure in her father, mother, and paternal grandmother; Hypertension in her father, maternal grandfather, maternal grandmother, mother, paternal grandfather, and paternal grandmother; Lung cancer in her mother; Ovarian cancer in her maternal aunt and paternal aunt; Stroke in her paternal grandmother. There is no history of Colon cancer. ROS:   Please see the history of present illness.    All 14 point review of systems negative except as described per history of present illness.  EKGs/Labs/Other Studies Reviewed:    The following studies were reviewed today: EKG done in the emergency room on fourth of this month showed normal sinus rhythm normal P interval nonspecific ST-T segment changes poor our progression anterior precordium.  Recent Labs: 05/04/2018: ALT 13; BUN 8; Creat 0.54; Hemoglobin 14.8; Platelets 255; Potassium 3.6; Sodium 138  Recent Lipid Panel    Component Value Date/Time   CHOL 262 (H) 05/04/2018 1345   TRIG 176 (H) 05/04/2018 1345   HDL 47 (L) 05/04/2018 1345   CHOLHDL 5.6 (H) 05/04/2018 1345   VLDL 29.0 08/31/2011 1110   LDLCALC 182 (H) 05/04/2018 1345   LDLDIRECT 174.7 08/31/2011 1110    Physical Exam:    VS:  BP 110/62   Pulse 78   Ht  (1.676 m)   Wt 218 lb 6.4 oz (99.1 kg)   LMP 03/13/2002   SpO2 98%   BMI 35.25 kg/m     Wt Readings from Last 3 Encounters:   05/10/18 218 lb 6.4 oz (99.1 kg)  05/04/18 217 lb 4 oz (98.5 kg)  04/30/18 180 lb (81.6 kg)     GEN:  Well nourished, well developed in no acute distress HEENT: Normal NECK: No JVD; No carotid bruits LYMPHATICS: No lymphadenopathy CARDIAC: RRR, no murmurs, no rubs, no gallops RESPIRATORY:  Clear to auscultation without rales, wheezing or rhonchi  ABDOMEN: Soft, non-tender, non-distended MUSCULOSKELETAL:  No edema; No deformity  SKIN: Warm and dry NEUROLOGIC:  Alert and oriented x 3 PSYCHIATRIC:  Normal affect   ASSESSMENT:    1. Anxiety and depression   2. Atypical chest pain   3. Dyslipidemia   4. Smoking    PLAN:    In order of problems listed above:  1. Atypical chest pain.  We had a long discussion about what to do with the situation pain is atypical but because of her significant risk factors including multiple family members having early coronary artery disease I think it will be prudent to proceed with stress echocardiogram.  I asked her to start taking one baby aspirin until we have stress test done.  Rest of the medication will be the same. 2. Dyslipidemia with high dramatically high LDL.  I advised to continue with statin and we discussed potential side effects likely she does not have any but she was very anxious about continuation of this medication. 3. Citing depression I think the base of her symptoms is because of this problem she tried different medication with no success.  We talked in length about this in my opinion she need to learn how to deal with stressful situations not to go for alcohol and smoking but rather exercises and maybe some relaxation technique we talked at length about this and I gave her actually phone number for psychotherapy so she can call and get some help that way. 4. Smoking clearly essential to consider quitting.  We spent a great deal of time talking about this and strongly recommend to do it I will continue this discussion with her in the  future. 5. We also talk about healthy lifestyle exercises on the regular basis and good eating habits.  Again this discussion will continue.  She was told to go to the emergency room if you have pain that is not relieved by rest.   Medication Adjustments/Labs and Tests Ordered: Current medicines are reviewed at length with the patient today.  Concerns regarding medicines are outlined above.  No orders of the defined types were placed in this encounter.  No orders of the defined types were placed in this encounter.   Signed, Georgeanna Lea, MD, [facility]. 05/10/2018 2:37 PM    [facility]

## 2018-05-10 NOTE — Telephone Encounter (Signed)
Summary: not feeling better    Pt was seen Friday and states that her throat is still sore, glands swollen, and she is hoarse. She states she is not feeling better. Would like to discuss with a nurse.      Call to patient: Patient was told to call back if she did not improve- she has not improved with over the counter measures. Patient is requesting antibiotic treatment. If something is called in- can she also be treated for yeast infection. (She always gets after antibiotic- patient requesting Diflucan- 2 pills)  Reason for Disposition . [1] Request for URGENT new prescription or refill of "essential" medication (i.e., likelihood of harm to patient if not taken) AND [2] triager unable to fill per unit policy  Answer Assessment - Initial Assessment Questions 1. LOCATION: "Where is the swollen node located?" "Is the matching node on the other side of the body also swollen?"      Gland in neck are swollen and sore- both sides 2. SIZE: "How big is the node?" (Inches or centimeters) (or compare to common objects such as pea, bean, marble, golf ball)      Can be palpated- bean size 3. ONSET: "When did the swelling start?"      Not larger than when in office 4. NECK NODES: "Is there a sore throat, runny nose or other symptoms of a cold?"      Sore throat- itching, runny nose, headache 5. GROIN OR ARMPIT NODES: "Is there a sore, scratch, cut or painful red area on that arm or leg?"      no 6. FEVER: "Do you have a fever?" If so, ask: "What is it, how was it measured, and when did it start?"      no 7. CAUSE: "What do you think is causing the swollen lymph nodes?"     Possible sinus infection- change in color of mucus- green/yellow 8. OTHER SYMPTOMS: "Do you have any other symptoms?"     Not getting better with over the counter 9. PREGNANCY: "Is there any chance you are pregnant?" "When was your last menstrual period?"     n/a  Answer Assessment - Initial Assessment Questions 1. SYMPTOMS: "Do  you have any symptoms?"     Swollen lymph node, sore throat 2. SEVERITY: If symptoms are present, ask "Are they mild, moderate or severe?"     Symptoms have not improved using over the counter treatments suggested by provider at visit last week- sputum has changed and glands are still swollen and sore. Patient is requesting medication.  Protocols used: MEDICATION QUESTION CALL-A-AH, LYMPH NODES SWOLLEN-A-AH

## 2018-05-11 ENCOUNTER — Ambulatory Visit: Payer: Self-pay | Admitting: Family Medicine

## 2018-05-11 NOTE — Telephone Encounter (Signed)
Recommend a nurse visit. Check a rapid strep test.  Let me know results. If she is running fever, check a flu test

## 2018-05-11 NOTE — Telephone Encounter (Signed)
PCP out of office today. However he may be working from home. Will send for advice.

## 2018-05-11 NOTE — Telephone Encounter (Signed)
I don't believe we can bring her in for nurse visits and test.

## 2018-05-12 NOTE — Telephone Encounter (Signed)
They can, arrange for monday

## 2018-05-14 NOTE — Telephone Encounter (Signed)
Spoke w/ Carollee Herter and Dawn- okay to bring Pt in for nurse visit for strep testing. Spoke w/ Pt- she is actually feeling better. Apologized for the delayed call- Pt verbalized understanding.

## 2018-06-01 ENCOUNTER — Ambulatory Visit (INDEPENDENT_AMBULATORY_CARE_PROVIDER_SITE_OTHER): Payer: BLUE CROSS/BLUE SHIELD | Admitting: Internal Medicine

## 2018-06-01 ENCOUNTER — Ambulatory Visit (HOSPITAL_BASED_OUTPATIENT_CLINIC_OR_DEPARTMENT_OTHER): Payer: BLUE CROSS/BLUE SHIELD

## 2018-06-01 ENCOUNTER — Other Ambulatory Visit: Payer: Self-pay

## 2018-06-01 DIAGNOSIS — F329 Major depressive disorder, single episode, unspecified: Secondary | ICD-10-CM | POA: Diagnosis not present

## 2018-06-01 DIAGNOSIS — F17200 Nicotine dependence, unspecified, uncomplicated: Secondary | ICD-10-CM | POA: Diagnosis not present

## 2018-06-01 DIAGNOSIS — F419 Anxiety disorder, unspecified: Secondary | ICD-10-CM | POA: Diagnosis not present

## 2018-06-01 DIAGNOSIS — R0789 Other chest pain: Secondary | ICD-10-CM | POA: Diagnosis not present

## 2018-06-01 MED ORDER — VARENICLINE TARTRATE 1 MG PO TABS: 1.0000 mg | ORAL_TABLET | Freq: Two times a day (BID) | ORAL | 0 refills | Status: AC

## 2018-06-01 MED ORDER — VARENICLINE TARTRATE 0.5 MG X 11 & 1 MG X 42 PO MISC: ORAL | 0 refills | Status: AC

## 2018-06-01 MED ORDER — CLONAZEPAM 0.5 MG PO TABS: 0.5000 mg | ORAL_TABLET | Freq: Two times a day (BID) | ORAL | 0 refills | Status: AC | PRN

## 2018-06-01 MED ORDER — HYDROXYZINE HCL 10 MG PO TABS: 10.0000 mg | ORAL_TABLET | Freq: Three times a day (TID) | ORAL | 0 refills | Status: AC | PRN

## 2018-06-01 NOTE — Progress Notes (Signed)
Subjective:    Patient ID: Kylie Munoz, female    DOB: 03/20/1975, 43 y.o.   MRN: [id number]  DOS:  06/01/2018 Type of visit - description: Virtual Visit via Video Note  I connected with@ on 06/04/18 at 11:00 AM EDT by a video enabled telemedicine application and verified that I am speaking with the correct person using two identifiers.   THIS ENCOUNTER IS A VIRTUAL VISIT DUE TO COVID-19 - PATIENT WAS NOT SEEN IN THE OFFICE. PATIENT HAS CONSENTED TO VIRTUAL VISIT / TELEMEDICINE VISIT   Location of patient: home  Location of provider: office  I discussed the limitations of evaluation and management by telemedicine and the availability of in person appointments. The patient expressed understanding and agreed to proceed.  History of Present Illness: Follow-up from previous visit Chest pain: Saw cardiology, note reviewed.  Still having occasional chest pain Anxiety: Tried Cymbalta, self discontinued, did not like the way it makes her feel. Tobacco abuse: start Chantix?  Started Lipitor, initially she felt some lower extremity cramps, they are resolved. she is trying to eat healthier and remain active.  Review of Systems Stress has increased, she got laid off from work due to coronavirus  Past Medical History:  Diagnosis Date  . Anxiety   . Anxiety and depression   . Obesity     Past Surgical History:  Procedure Laterality Date  . ABDOMINAL HYSTERECTOMY  2004   has left ovary  . CARPAL TUNNEL RELEASE Right 2009  . CESAREAN SECTION  2001  . DENTAL SURGERY  2006  . LAPAROSCOPIC APPENDECTOMY N/A 07/23/2015   Procedure: APPENDECTOMY LAPAROSCOPIC;  Surgeon: Karie Soda, MD;  Location: [facility];  Service: General;  Laterality: N/A;  . UNILATERAL SALPINGECTOMY Right 2004    Social History   Socioeconomic History  . Marital status: Divorced    Spouse name: Not on file  . Number of children: 2  . Years of education: Not on file  . Highest education level: Not on file   Occupational History  . Occupation: Retail buyer  Social Needs  . Financial resource strain: Not on file  . Food insecurity:    Worry: Not on file    Inability: Not on file  . Transportation needs:    Medical: Not on file    Non-medical: Not on file  Tobacco Use  . Smoking status: Current Some Day Smoker    Packs/day: 0.50    Types: Cigarettes  . Smokeless tobacco: Never Used  . Tobacco comment: < 1/2 ppd  Substance and Sexual Activity  . Alcohol use: Not Currently  . Drug use: No  . Sexual activity: Not on file  Lifestyle  . Physical activity:    Days per week: Not on file    Minutes per session: Not on file  . Stress: Not on file  Relationships  . Social connections:    Talks on phone: Not on file    Gets together: Not on file    Attends religious service: Not on file    Active member of club or organization: Not on file    Attends meetings of clubs or organizations: Not on file    Relationship status: Not on file  . Intimate partner violence:    Fear of current or ex partner: Not on file    Emotionally abused: Not on file    Physically abused: Not on file    Forced sexual activity: Not on file  Other Topics Concern  . Not  on file  Social History Narrative   Single mom    House   Boy [phone], married live w/ Amanda   Boy- Elijah Birk 2001   Had a step daughter , does not see her       Allergies as of 06/01/2018      Reactions   Iohexol     Code: HIVES, Desc: PT had a near syncope event,developed hives & difficulty swallowing post injection of 125 ml Omni 300.Treated by Dr.McAlhany with 50 mg PO,  IV benedryl, 125 mg solu-cortef, 0.3 mg Epinephrine IM, 40 mg famotidine PO. Transferred to [facility] ED via EMS, Onset Date: [id number]   Tramadol Hcl    REACTION: itching      Medication List       Accurate as of June 01, 2018 11:59 PM. Always use your most recent med list.        atorvastatin 20 MG tablet Commonly known as:  LIPITOR Take 1 tablet (20 mg total) by  mouth at bedtime.   AZO-STANDARD PO Take by mouth daily.   clonazePAM 0.5 MG tablet Commonly known as:  KLONOPIN Take 1 tablet (0.5 mg total) by mouth 2 (two) times daily as needed for anxiety.   FISH OIL PO Take by mouth.   hydrOXYzine 10 MG tablet Commonly known as:  ATARAX/VISTARIL Take 1 tablet (10 mg total) by mouth 3 (three) times daily as needed for anxiety.   ibuprofen 200 MG tablet Commonly known as:  ADVIL,MOTRIN You can take 2-3 tablets every 6 hours as needed for pain   varenicline 0.5 MG X 11 & 1 MG X 42 tablet Commonly known as:  Chantix Starting Month Pak Take one 0.5 mg tablet by mouth once daily for 3 days, then increase to one 0.5 mg tablet twice daily for 4 days, then increase to one 1 mg tablet twice daily.   varenicline 1 MG tablet Commonly known as:  Chantix Continuing Month Pak Take 1 tablet (1 mg total) by mouth 2 (two) times daily. To be dispense 1 month after the starting pack   VITAMIN C PO Take by mouth.   WOMENS ONE DAILY PO Take by mouth.           Objective:   Physical Exam LMP 03/13/2002  This is a video visit, she is alert and oriented x3, she looks calm and collected    Assessment     ASSESSMENT   Anxiety and depression: years ago tried wellbutrin ("didn't feel right"), also SSRIs (didn't work for her) Hyperlipidemia LDL 182 (05/04/2018), started Lipitor H/o abnormal Pap smears +FH Lung ca (mother)   Plan:  Anxiety, depression: See last visit, was prescribed Cymbalta, she self stopped after 2 weeks because did not feel that way it made her feel>> "I think if got me depressed,  I just like you to lay down in bed"  At this point, she is intolerant to SSRIs and NSRIs. rec will not go back on Xanax due to potential for addiction/abuse.  As a compromise I agreed to prescribe: Clonazepam twice a day as needed Atarax 3 times a day as needed Side effects such as somnolence discussed. I also encouraged nonpharmacological therapies such  as yoga, medications.  There are free apps that could help with relaxation techniques.  Seeing a counselor is not feasible at this point due to finances.  She seems to understand.  Reassess in 3 to 4 weeks Hyperlipidemia: Last LDL elevated, started Lipitor 20 mg.  Will need labs  in few weeks Chest pain: Since the last visit, saw cardiology, plan was to do a stress echocardiogram and start aspirin.  The stress echo likely will be delayed due to the coronavirus pandemia Tobacco abuse: Patient is willing to try to quit, requests Chantix, I see no contraindication.  I advised her to read carefully the side effect list from the pharmacy, at the same time I told her that has been very effective for many of my patients.  To quit 2 weeks into Chantix, okay to use a small amounts of nicotine supplements initially. RTC  in 3 to 4 weeks via video conference.  Today, I spent more than 25   min with the patient: >50% of the time counseling regards anxiety management, high we should do believe Xanax.  Also counseled about tobacco cessation, how to use Chantix.  Multiple questions answered.    I discussed the assessment and treatment plan with the patient. The patient was provided an opportunity to ask questions and all were answered. The patient agreed with the plan and demonstrated an understanding of the instructions.   The patient was advised to call back or seek an in-person evaluation if the symptoms worsen or if the condition fails to improve as anticipated.

## 2018-06-04 NOTE — Assessment & Plan Note (Signed)
Anxiety, depression: See last visit, was prescribed Cymbalta, she self stopped after 2 weeks because did not feel that way it made her feel>> "I think if got me depressed,  I just like you to lay down in bed"  At this point, she is intolerant to SSRIs and NSRIs. rec will not go back on Xanax due to potential for addiction/abuse.  As a compromise I agreed to prescribe: Clonazepam twice a day as needed Atarax 3 times a day as needed Side effects such as somnolence discussed. I also encouraged nonpharmacological therapies such as yoga, medications.  There are free apps that could help with relaxation techniques.  Seeing a counselor is not feasible at this point due to finances.  She seems to understand.  Reassess in 3 to 4 weeks Hyperlipidemia: Last LDL elevated, started Lipitor 20 mg.  Will need labs in few weeks Chest pain: Since the last visit, saw cardiology, plan was to do a stress echocardiogram and start aspirin.  The stress echo likely will be delayed due to the coronavirus pandemia Tobacco abuse: Patient is willing to try to quit, requests Chantix, I see no contraindication.  I advised her to read carefully the side effect list from the pharmacy, at the same time I told her that has been very effective for many of my patients.  To quit 2 weeks into Chantix, okay to use a small amounts of nicotine supplements initially. RTC  in 3 to 4 weeks via video conference.

## 2018-06-13 ENCOUNTER — Telehealth: Payer: Self-pay | Admitting: Cardiology

## 2018-06-13 NOTE — Telephone Encounter (Signed)
Virtual Visit Pre-Appointment Phone Call  Steps For Call:  1. Confirm consent - "In the setting of the current Covid19 crisis, you are scheduled for a (phone or video) visit with your provider on (date) at (time).  Just as we do with many in-office visits, in order for you to participate in this visit, we must obtain consent.  If you'd like, I can send this to your mychart (if signed up) or email for you to review.  Otherwise, I can obtain your verbal consent now.  All virtual visits are billed to your insurance company just like a normal visit would be.  By agreeing to a virtual visit, we'd like you to understand that the technology does not allow for your provider to perform an examination, and thus may limit your provider's ability to fully assess your condition.  Finally, though the technology is pretty good, we cannot assure that it will always work on either your or our end, and in the setting of a video visit, we may have to convert it to a phone-only visit.  In either situation, we cannot ensure that we have a secure connection.  Are you willing to proceed?" STAFF: Did the patient verbally acknowledge consent to telehealth visit? Document YES/NO here: YES  2. Confirm the BEST phone number to call the day of the visit by including in appointment notes  3. Give patient instructions for WebEx/MyChart download to smartphone as below or Doximity/Doxy.me if video visit (depending on what platform provider is using)  4. Advise patient to be prepared with their blood pressure, heart rate, weight, any heart rhythm information, their current medicines, and a piece of paper and pen handy for any instructions they may receive the day of their visit  5. Inform patient they will receive a phone call 15 minutes prior to their appointment time (may be from unknown caller ID) so they should be prepared to answer  6. Confirm that appointment type is correct in Epic appointment notes (VIDEO vs  PHONE)     TELEPHONE CALL NOTE  Kylie Munoz has been deemed a candidate for a follow-up tele-health visit to limit community exposure during the Covid-19 pandemic. I spoke with the patient via phone to ensure availability of phone/video source, confirm preferred email & phone number, and discuss instructions and expectations.  I reminded Kylie Munoz to be prepared with any vital sign and/or heart rhythm information that could potentially be obtained via home monitoring, at the time of her visit. I reminded Kylie Munoz to expect a phone call at the time of her visit if her visit.  Kylie Munoz 06/13/2018 10:53 AM   INSTRUCTIONS FOR DOWNLOADING THE WEBEX APP TO SMARTPHONE  - If Apple, ask patient to go to App Store and type in WebEx in the search bar. Download [employer], the blue/green circle. If Android, go to [employer] and type in [employer] in the search bar. The app is free but as with any other app downloads, their phone may require them to verify saved payment information or Apple/Android password.  - The patient does NOT have to create an account. - On the day of the visit, the assist will walk the patient through joining the meeting with the meeting number/password.  INSTRUCTIONS FOR DOWNLOADING THE MYCHART APP TO SMARTPHONE  - The patient must first make sure to have activated MyChart and know their login information - If Apple, go to [employer] and type in  MyChart in the search bar and download the app. If Android, ask patient to go to [employer] and type in [place] in the search bar and download the app. The app is free but as with any other app downloads, their phone may require them to verify saved payment information or Apple/Android password.  - The patient will need to then log into the app with their MyChart username and password, and select [facility] as their healthcare provider to link the account. When it is time for your visit, go to the  MyChart app, find appointments, and click Begin Video Visit. Be sure to Select Allow for your device to access the Microphone and Camera for your visit. You will then be connected, and your provider will be with you shortly.  **If they have any issues connecting, or need assistance please contact MyChart service desk [phone])**  **If using a computer, in order to ensure the best quality for their visit they will need to use either of the following Internet Browsers: [employer], or Google Chrome**  IF USING DOXIMITY or DOXY.ME - The patient will receive a link just prior to their visit, either by text or email (to be determined day of appointment depending on if it's doxy.me or Doximity).     FULL LENGTH CONSENT FOR TELE-HEALTH VISIT   I hereby voluntarily request, consent and authorize [facility] and its employed or contracted physicians, physician assistants, nurse practitioners or other licensed health care professionals (the Practitioner), to provide me with telemedicine health care services (the Services") as deemed necessary by the treating Practitioner. I acknowledge and consent to receive the Services by the Practitioner via telemedicine. I understand that the telemedicine visit will involve communicating with the Practitioner through live audiovisual communication technology and the disclosure of certain medical information by electronic transmission. I acknowledge that I have been given the opportunity to request an in-person assessment or other available alternative prior to the telemedicine visit and am voluntarily participating in the telemedicine visit.  I understand that I have the right to withhold or withdraw my consent to the use of telemedicine in the course of my care at any time, without affecting my right to future care or treatment, and that the Practitioner or I may terminate the telemedicine visit at any time. I understand that I have the right to  inspect all information obtained and/or recorded in the course of the telemedicine visit and may receive copies of available information for a reasonable fee.  I understand that some of the potential risks of receiving the Services via telemedicine include:   Delay or interruption in medical evaluation due to technological equipment failure or disruption;  Information transmitted may not be sufficient (e.g. poor resolution of images) to allow for appropriate medical decision making by the Practitioner; and/or   In rare instances, security protocols could fail, causing a breach of personal health information.  Furthermore, I acknowledge that it is my responsibility to provide information about my medical history, conditions and care that is complete and accurate to the best of my ability. I acknowledge that Practitioner's advice, recommendations, and/or decision may be based on factors not within their control, such as incomplete or inaccurate data provided by me or distortions of diagnostic images or specimens that may result from electronic transmissions. I understand that the practice of medicine is not an exact science and that Practitioner makes no warranties or guarantees regarding treatment outcomes. I acknowledge that I will receive a copy  of this consent concurrently upon execution via email to the email address I last provided but may also request a printed copy by calling the office of [facility].    I understand that my insurance will be billed for this visit.   I have read or had this consent read to me.  I understand the contents of this consent, which adequately explains the benefits and risks of the Services being provided via telemedicine.   I have been provided ample opportunity to ask questions regarding this consent and the Services and have had my questions answered to my satisfaction.  I give my informed consent for the services to be provided through the use of  telemedicine in my medical care  By participating in this telemedicine visit I agree to the above.

## 2018-06-22 ENCOUNTER — Telehealth (INDEPENDENT_AMBULATORY_CARE_PROVIDER_SITE_OTHER): Payer: BLUE CROSS/BLUE SHIELD | Admitting: Cardiology

## 2018-06-22 ENCOUNTER — Other Ambulatory Visit: Payer: Self-pay

## 2018-06-22 ENCOUNTER — Encounter: Payer: Self-pay | Admitting: Cardiology

## 2018-06-22 DIAGNOSIS — F329 Major depressive disorder, single episode, unspecified: Secondary | ICD-10-CM

## 2018-06-22 DIAGNOSIS — E785 Hyperlipidemia, unspecified: Secondary | ICD-10-CM

## 2018-06-22 DIAGNOSIS — R0789 Other chest pain: Secondary | ICD-10-CM

## 2018-06-22 DIAGNOSIS — F17200 Nicotine dependence, unspecified, uncomplicated: Secondary | ICD-10-CM

## 2018-06-22 DIAGNOSIS — F419 Anxiety disorder, unspecified: Secondary | ICD-10-CM

## 2018-06-22 NOTE — Patient Instructions (Signed)
Medication Instructions:  Your physician recommends that you continue on your current medications as directed. Please refer to the Current Medication list given to you today.  If you need a refill on your cardiac medications before your next appointment, please call your pharmacy.   Lab work: None.  If you have labs (blood work) drawn today and your tests are completely normal, you will receive your results only by: . MyChart Message (if you have MyChart) OR . A paper copy in the mail If you have any lab test that is abnormal or we need to change your treatment, we will call you to review the results.  Testing/Procedures: None.   Follow-Up: At [facility], you and your health needs are our priority.  As part of our continuing mission to provide you with exceptional heart care, we have created designated Provider Care Teams.  These Care Teams include your primary Cardiologist (physician) and Advanced Practice Providers (APPs -  Physician Assistants and Nurse Practitioners) who all work together to provide you with the care you need, when you need it. You will need a follow up appointment in 6 weeks.   Any Other Special Instructions Will Be Listed Below (If Applicable).     

## 2018-06-22 NOTE — Progress Notes (Signed)
Virtual Visit via Video Note   This visit type was conducted due to national recommendations for restrictions regarding the COVID-19 Pandemic (e.g. social distancing) in an effort to limit this patient's exposure and mitigate transmission in our community.  Due to her co-morbid illnesses, this patient is at least at moderate risk for complications without adequate follow up.  This format is felt to be most appropriate for this patient at this time.  All issues noted in this document were discussed and addressed.  A limited physical exam was performed with this format.  Please refer to the patient's chart for her consent to telehealth for [facility].  Evaluation Performed:  Follow-up visit  This visit type was conducted due to national recommendations for restrictions regarding the COVID-19 Pandemic (e.g. social distancing).  This format is felt to be most appropriate for this patient at this time.  All issues noted in this document were discussed and addressed.  No physical exam was performed (except for noted visual exam findings with Video Visits).  Please refer to the patient's chart (MyChart message for video visits and phone note for telephone visits) for the patient's consent to telehealth for [facility].  Date:  06/22/2018  ID: Kylie Munoz, DOB [birth year], MRN [id number]   Patient Location: [address] [place] [id number]   Provider location:   [facility] [place] [facility]  PCP:  Wanda PlumpPaz, Jose E, MD  Cardiologist:  Gypsy Balsamobert Krasowski, MD     Chief Complaint: Doing much better  History of Present Illness:    Kylie CoteMary C Olund is a 43 y.o. female  who presents via audio/video conferencing for a telehealth visit today.  She was referred to me because of atypical chest pain.  We had a long discussion that include a lot of issues that she had probably the biggest one was simply being overworked and overstressed.  She deal with the stress with smoking and drinking alcohol.  Pain  was atypical and the plan was to schedule stress test in spite of low level suspicion for significant coronary artery disease.  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  However she used this situation to her advantage she started exercising on the regular basis she exercise in the morning she also walk a lot in the matter-of-fact when I talked to her she is on her walk.  She enjoyed doing it she already lost some weight she also started changing her diet with benefits.  She is cutting down cigarettes as well as drinking she said she allowed herself to have 1 drink only over weekend over a week she does not drink.  She talk to her primary care physician to get prescription for Chantix this is getting ready to start regimen to quit smoking as well overall and very pleased with the progress.  We talked about stress this and I told her honestly had a low level of suspicion for her having significant coronary artery disease and I also told her that if she is asymptomatic we can simply abandon stress test however I will talk to her again in about 6 weeks or so to see how she does.   The patient does not have symptoms concerning for COVID-19 infection (fever, chills, cough, or new SHORTNESS OF BREATH).    Prior CV studies:   The following studies were reviewed  today:       Past Medical History:  Diagnosis Date  . Anxiety   . Anxiety and depression   . Obesity     Past Surgical History:  Procedure Laterality Date  . ABDOMINAL HYSTERECTOMY  2004   has left ovary  . CARPAL TUNNEL RELEASE Right 2009  . CESAREAN SECTION  2001  . DENTAL SURGERY  2006  . LAPAROSCOPIC APPENDECTOMY N/A 07/23/2015   Procedure: APPENDECTOMY LAPAROSCOPIC;  Surgeon: Karie Soda, MD;  Location: [facility];  Service: General;  Laterality: N/A;  . UNILATERAL SALPINGECTOMY Right 2004     Current Meds   Medication Sig  . Ascorbic Acid (VITAMIN C PO) Take by mouth.  Marland Kitchen atorvastatin (LIPITOR) 20 MG tablet Take 1 tablet (20 mg total) by mouth at bedtime.  . clonazePAM (KLONOPIN) 0.5 MG tablet Take 1 tablet (0.5 mg total) by mouth 2 (two) times daily as needed for anxiety.  . hydrOXYzine (ATARAX/VISTARIL) 10 MG tablet Take 1 tablet (10 mg total) by mouth 3 (three) times daily as needed for anxiety.  Marland Kitchen ibuprofen (ADVIL,MOTRIN) 200 MG tablet You can take 2-3 tablets every 6 hours as needed for pain  . Multiple Vitamins-Minerals (WOMENS ONE DAILY PO) Take by mouth.  . Omega-3 Fatty Acids (FISH OIL PO) Take by mouth.  . Phenazopyridine HCl (AZO-STANDARD PO) Take by mouth daily.  . varenicline (CHANTIX CONTINUING MONTH PAK) 1 MG tablet Take 1 tablet (1 mg total) by mouth 2 (two) times daily. To be dispense 1 month after the starting pack  . varenicline (CHANTIX STARTING MONTH PAK) 0.5 MG X 11 & 1 MG X 42 tablet Take one 0.5 mg tablet by mouth once daily for 3 days, then increase to one 0.5 mg tablet twice daily for 4 days, then increase to one 1 mg tablet twice daily.      Family History: The patient's family history includes Breast cancer in her maternal grandmother and mother; Breast cancer (age of onset: 35) in her paternal grandmother; CAD in her father; Diabetes in her father, maternal grandmother, mother, and paternal grandmother; Heart failure in her father, mother, and paternal grandmother; Hypertension in her father, maternal grandfather, maternal grandmother, mother, paternal grandfather, and paternal grandmother; Lung cancer in her mother; Ovarian cancer in her maternal aunt and paternal aunt; Stroke in her paternal grandmother. There is no history of Colon cancer.   ROS:   Please see the history of present illness.     All other systems reviewed and are negative.   Labs/Other Tests and Data Reviewed:     Recent Labs: 05/04/2018: ALT 13; BUN 8; Creat 0.54; Hemoglobin 14.8; Platelets 255;  Potassium 3.6; Sodium 138  Recent Lipid Panel    Component Value Date/Time   CHOL 262 (H) 05/04/2018 1345   TRIG 176 (H) 05/04/2018 1345   HDL 47 (L) 05/04/2018 1345   CHOLHDL 5.6 (H) 05/04/2018 1345   VLDL 29.0 08/31/2011 1110   LDLCALC 182 (H) 05/04/2018 1345   LDLDIRECT 174.7 08/31/2011 1110      Exam:    Vital Signs:  LMP 03/13/2002     Wt Readings from Last 3 Encounters:  05/10/18 218 lb 6.4 oz (99.1 kg)  05/04/18 217 lb 4 oz (98.5 kg)  04/30/18 180 lb (81.6 kg)     Well nourished, well developed in no acute distress. Alert awake and x3.  I am talking to her over the video link.  She is actually with her dog on the walk and she  said she walks every day for at least 1 hour.  No swelling no JVD  Diagnosis for this visit:   1. Atypical chest pain   2. Dyslipidemia   3. Smoking   4. Anxiety and depression      ASSESSMENT & PLAN:    1.  Atypical chest pain: Denies having any.  We will talk about doing stress test but now she is asymptomatic doing very well try to exercise quite aggressively have no symptoms we may abandon the idea of the stress test but will make a decision 6 weeks from now.  For now we do not do test in the office. 2.  Dyslipidemia she is on statin and few weeks we will recheck her fasting lipid profile. 3.  Smoking she got Chantix for her primary care physician and she is getting ready to quit I strongly encouraged her to do that and gave her some hints how to do it. 4.  Anxiety and depression.  She is doing much better in spite of stressful situation because of the fact that she is out of work but it looks like she is taking advantage of the situation and try to put herself on the right track including exercise and a good diet.  I congratulated her for it and encouraged her to continue doing this.  COVID-19 Education: The signs and symptoms of COVID-19 were discussed with the patient and how to seek care for testing (follow up with PCP or arrange  E-visit).  The importance of social distancing was discussed today.  Patient Risk:   After full review of this patients clinical status, I feel that they are at least moderate risk at this time.  Time:   Today, I have spent 20 minutes with the patient with telehealth technology discussing pt health issues.  I spent 5 minutes reviewing her chart before the visit.  Visit was finished at 10:18 AM.    Medication Adjustments/Labs and Tests Ordered: Current medicines are reviewed at length with the patient today.  Concerns regarding medicines are outlined above.  No orders of the defined types were placed in this encounter.  Medication changes: No orders of the defined types were placed in this encounter.    Disposition: See her back in 6 weeks  Signed, Georgeanna Lea, MD, [facility] 06/22/2018 10:20 AM    [facility]

## 2018-06-29 ENCOUNTER — Ambulatory Visit (INDEPENDENT_AMBULATORY_CARE_PROVIDER_SITE_OTHER): Payer: BLUE CROSS/BLUE SHIELD | Admitting: Internal Medicine

## 2018-06-29 ENCOUNTER — Encounter: Payer: Self-pay | Admitting: Internal Medicine

## 2018-06-29 ENCOUNTER — Other Ambulatory Visit: Payer: Self-pay

## 2018-06-29 DIAGNOSIS — E785 Hyperlipidemia, unspecified: Secondary | ICD-10-CM

## 2018-06-29 DIAGNOSIS — F329 Major depressive disorder, single episode, unspecified: Secondary | ICD-10-CM

## 2018-06-29 DIAGNOSIS — F419 Anxiety disorder, unspecified: Secondary | ICD-10-CM | POA: Diagnosis not present

## 2018-06-29 DIAGNOSIS — F32A Depression, unspecified: Secondary | ICD-10-CM

## 2018-06-29 NOTE — Progress Notes (Signed)
Subjective:    Patient ID: Kylie Munoz, female    DOB: [birth year], 43 y.o.   MRN: [id number]  DOS:  06/29/2018 Type of visit - description: Virtual Visit via Video Note  I connected with@ on 06/29/18 at  1:00 PM EDT by a video enabled telemedicine application and verified that I am speaking with the correct person using two identifiers.   THIS ENCOUNTER IS A VIRTUAL VISIT DUE TO COVID-19 - PATIENT WAS NOT SEEN IN THE OFFICE. PATIENT HAS CONSENTED TO VIRTUAL VISIT / TELEMEDICINE VISIT   Location of patient: home  Location of provider: office  I discussed the limitations of evaluation and management by telemedicine and the availability of in person appointments. The patient expressed understanding and agreed to proceed.  History of Present Illness: Follow-up Since the last office visit, she is taking clonazepam and feeling great. Saw cardiology, note reviewed Still smoking. Has made significant changes on her lifestyle    Review of Systems Denies fever, chills. No cough or difficulty breathing Still not working but managing her stress a lot better.  Past Medical History:  Diagnosis Date  . Anxiety   . Anxiety and depression   . Obesity     Past Surgical History:  Procedure Laterality Date  . ABDOMINAL HYSTERECTOMY  2004   has left ovary  . CARPAL TUNNEL RELEASE Right 2009  . CESAREAN SECTION  2001  . DENTAL SURGERY  2006  . LAPAROSCOPIC APPENDECTOMY N/A 07/23/2015   Procedure: APPENDECTOMY LAPAROSCOPIC;  Surgeon: Karie SodaSteven Gross, MD;  Location: [facility];  Service: General;  Laterality: N/A;  . UNILATERAL SALPINGECTOMY Right 2004    Social History   Socioeconomic History  . Marital status: Divorced    Spouse name: Not on file  . Number of children: 2  . Years of education: Not on file  . Highest education level: Not on file  Occupational History  . Occupation: Retail buyerexecutive chef  Social Needs  . Financial resource strain: Not on file  . Food insecurity:    Worry:  Not on file    Inability: Not on file  . Transportation needs:    Medical: Not on file    Non-medical: Not on file  Tobacco Use  . Smoking status: Current Some Day Smoker    Packs/day: 0.50    Types: Cigarettes  . Smokeless tobacco: Never Used  . Tobacco comment: < 1/2 ppd  Substance and Sexual Activity  . Alcohol use: Not Currently  . Drug use: No  . Sexual activity: Not on file  Lifestyle  . Physical activity:    Days per week: Not on file    Minutes per session: Not on file  . Stress: Not on file  Relationships  . Social connections:    Talks on phone: Not on file    Gets together: Not on file    Attends religious service: Not on file    Active member of club or organization: Not on file    Attends meetings of clubs or organizations: Not on file    Relationship status: Not on file  . Intimate partner violence:    Fear of current or ex partner: Not on file    Emotionally abused: Not on file    Physically abused: Not on file    Forced sexual activity: Not on file  Other Topics Concern  . Not on file  Social History Narrative   Single mom    House   Boy [phone], married live w/  Sravya   Boy- Elijah Birk 2001   Had a step daughter , does not see her       Allergies as of 06/29/2018      Reactions   Iohexol     Code: HIVES, Desc: PT had a near syncope event,developed hives & difficulty swallowing post injection of 125 ml Omni 300.Treated by Dr.McAlhany with 50 mg PO, 25mg  IV benedryl, 125 mg solu-cortef, 0.3 mg Epinephrine IM, 40 mg famotidine PO. Transferred to [place] ED via EMS, Onset Date: [id number]   Tramadol Hcl    REACTION: itching      Medication List       Accurate as of Jun 29, 2018  1:03 PM. Always use your most recent med list.        atorvastatin 20 MG tablet Commonly known as:  LIPITOR Take 1 tablet (20 mg total) by mouth at bedtime.   AZO-STANDARD PO Take by mouth daily.   clonazePAM 0.5 MG tablet Commonly known as:  KLONOPIN Take 1 tablet (0.5 mg  total) by mouth 2 (two) times daily as needed for anxiety.   FISH OIL PO Take by mouth.   hydrOXYzine 10 MG tablet Commonly known as:  ATARAX/VISTARIL Take 1 tablet (10 mg total) by mouth 3 (three) times daily as needed for anxiety.   ibuprofen 200 MG tablet Commonly known as:  ADVIL You can take 2-3 tablets every 6 hours as needed for pain   varenicline 0.5 MG X 11 & 1 MG X 42 tablet Commonly known as:  Chantix Starting Month Pak Take one 0.5 mg tablet by mouth once daily for 3 days, then increase to one 0.5 mg tablet twice daily for 4 days, then increase to one 1 mg tablet twice daily.   varenicline 1 MG tablet Commonly known as:  Chantix Continuing Month Pak Take 1 tablet (1 mg total) by mouth 2 (two) times daily. To be dispense 1 month after the starting pack   VITAMIN C PO Take by mouth.   WOMENS ONE DAILY PO Take by mouth.           Objective:   Physical Exam LMP 03/13/2002  This is a video virtual visit, she is alert oriented x3, she looks much happier, very upbeat.    Assessment     ASSESSMENT   Anxiety and depression: years ago tried wellbutrin ("didn't feel right"), also SSRIs (didn't work for her) H/o abnormal Pap smears +FH Lung ca (mother)  PLAN Anxiety, depression: Since the last visit, started clonazepam, has taken it a few times with excellent results ("it helps 100%"), has not needed Atarax.  She also changes her lifestyle, exercising more, doing yoga, has lost some weight. Plan: Continue the same meds and lifestyle Hyperlipidemia: Plans to see cardiology in 6 weeks for recheck.  On Lipitor. Chest pain: Saw cardiology, they decided hold further evaluation since she is doing much better and now asymptomatic. Tobacco: Did not start Chantix, advised to start when ready. RTC 3 months, will call and schedule    I discussed the assessment and treatment plan with the patient. The patient was provided an opportunity to ask questions and all were answered.  The patient agreed with the plan and demonstrated an understanding of the instructions.   The patient was advised to call back or seek an in-person evaluation if the symptoms worsen or if the condition fails to improve as anticipated.

## 2018-07-01 NOTE — Assessment & Plan Note (Signed)
Anxiety, depression: Since the last visit, started clonazepam, has taken it a few times with excellent results ("it helps 100%"), has not needed Atarax.  She also changes her lifestyle, exercising more, doing yoga, has lost some weight. Plan: Continue the same meds and lifestyle Hyperlipidemia: Plans to see cardiology in 6 weeks for recheck.  On Lipitor. Chest pain: Saw cardiology, they decided hold further evaluation since she is doing much better and now asymptomatic. Tobacco: Did not start Chantix, advised to start when ready. RTC 3 months, will call and schedule

## 2018-07-09 ENCOUNTER — Telehealth: Payer: Self-pay | Admitting: Internal Medicine

## 2018-07-09 NOTE — Telephone Encounter (Signed)
Rec'd phone call from pt. on the COVID 19 nurse line.  Reported she has 4 adult children that live in her home.  Stated a coworker of her son, tested positive for COVID 19, and she needed information about what to do.  Stated the son rides in a truck with the coworker, so has had extended periods, being in close proximity to him.  Advised about having the son quarantine for 14 days, and to self isolate in the house, away from the rest of the family.  Advised on practicing safe practices within the family; to wash hands frequently, avoid hugging and kissing,to cough/ sneeze into kleenex or handkerchief, disinfecting high touch areas of the home.  Also recommended if her son, or any member of the family develops symptoms, they should quarantine for 7 days from onset of symptoms, and through time frame of at least 3 days, without symptoms.  Advised to call PCP, if symptoms do develop, for further recommendations.  Encouraged to call office with any further questions/ concerns.  Verb. Understanding.

## 2018-07-12 IMAGING — CT CT ABD-PELV W/O CM
2 of 4 series · 16 of 46 positions shown, 18 images · non-contrast
Comparison: CT abdomen and pelvis July 23, 2015

CLINICAL DATA: LEFT abdominal and flank pain, nausea, diarrhea for
4 days. History of appendectomy and hysterectomy.

EXAM:
CT ABDOMEN AND PELVIS WITHOUT CONTRAST
TECHNIQUE: Multidetector CT imaging of the abdomen and pelvis was performed
following the standard protocol without IV contrast.

[Series 2: axial st · axial · 0.89mm/px · z∈[+749,+1194]mm · 13 of 99 slices shown, 15 images]
[im 5/99  soft-tissue]
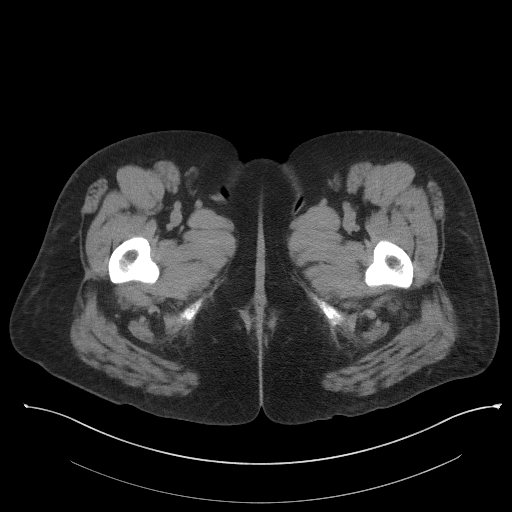
[im 5/99  bone]
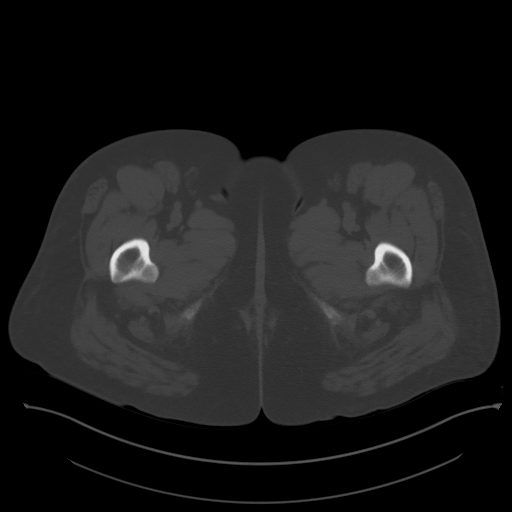
[im 13/99  soft-tissue]
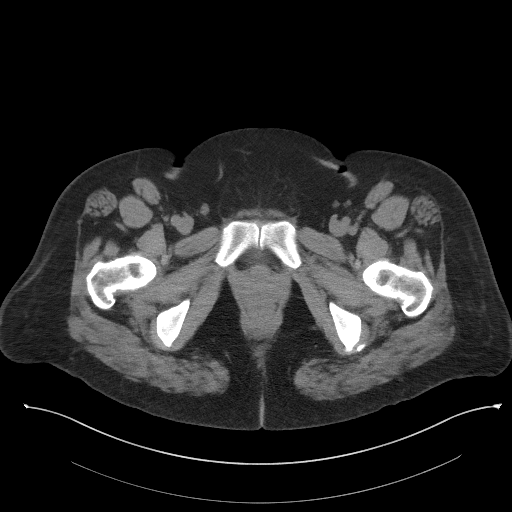
[im 21/99  soft-tissue]
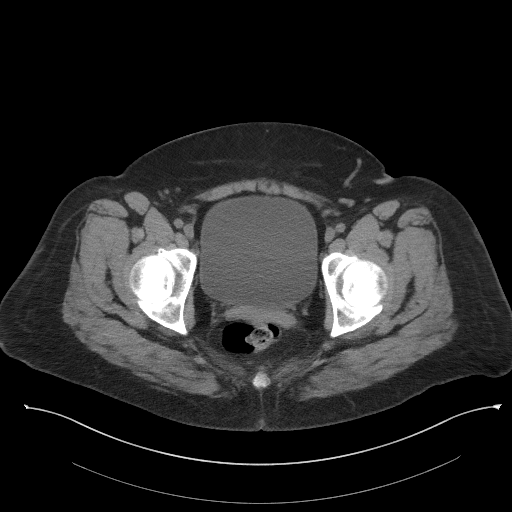
[im 29/99  soft-tissue]
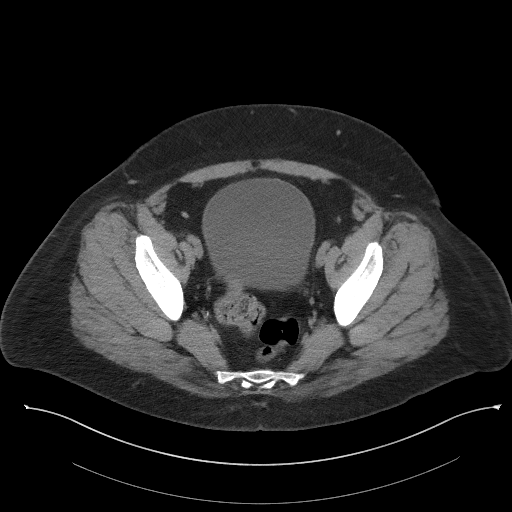
[im 33/99  soft-tissue]
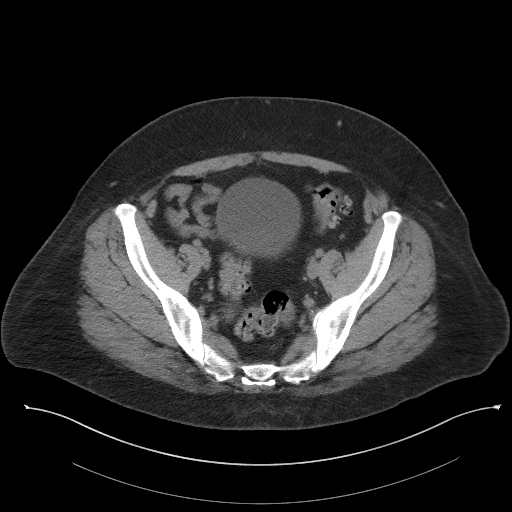
[im 41/99  soft-tissue]
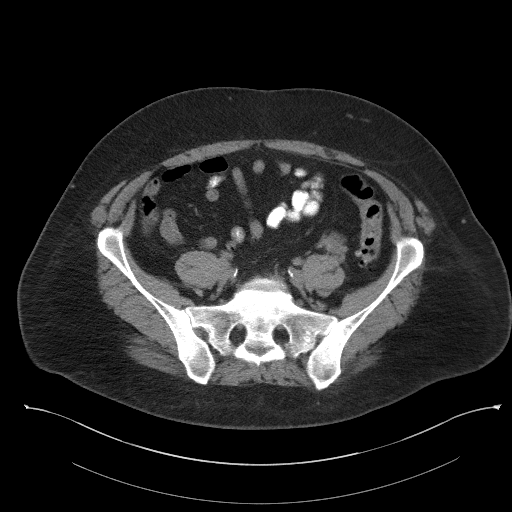
[im 50/99  soft-tissue]
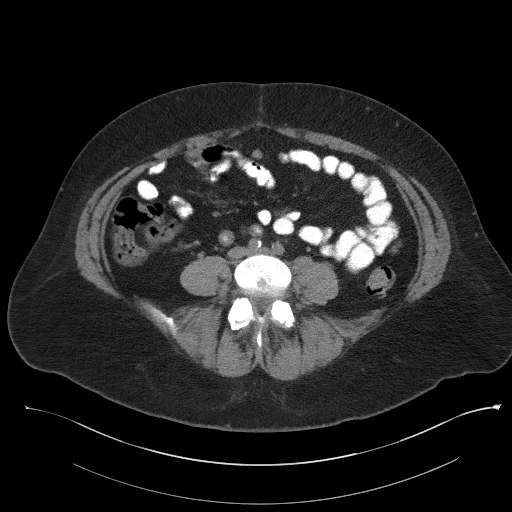
[im 58/99  soft-tissue]
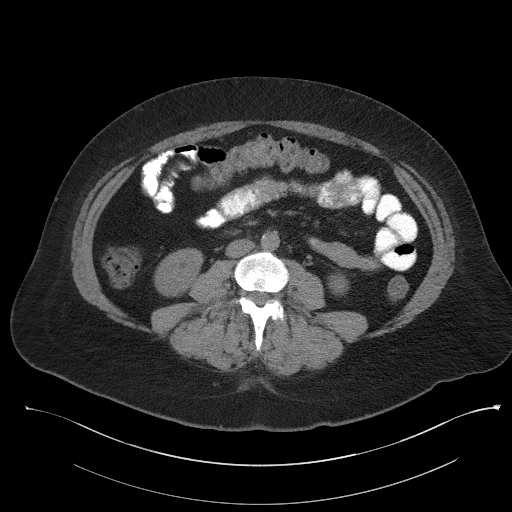
[im 66/99  soft-tissue]
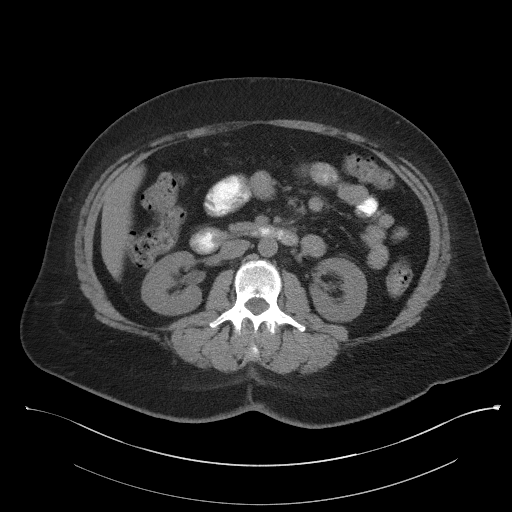
[im 66/99  bone]
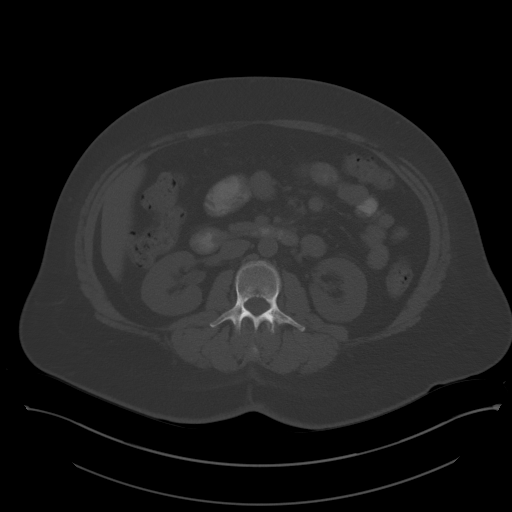
[im 70/99  soft-tissue]
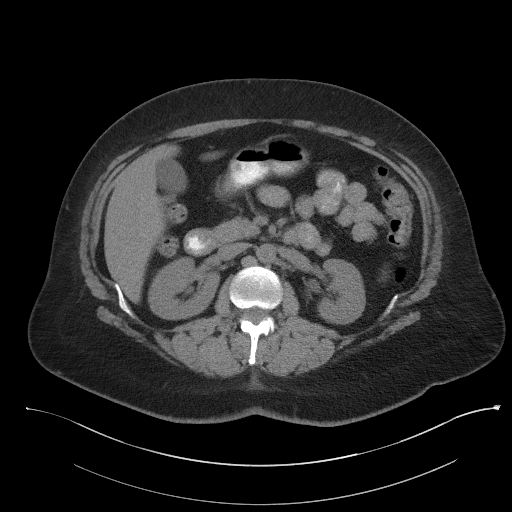
[im 78/99  soft-tissue]
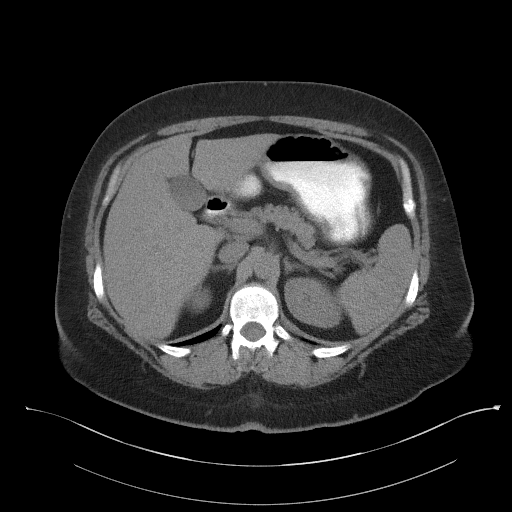
[im 86/99  soft-tissue]
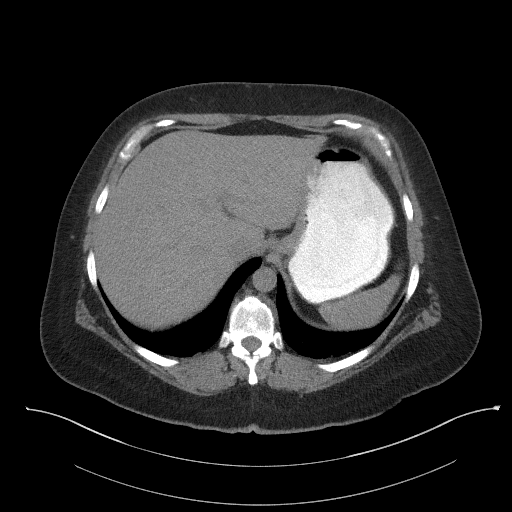
[im 94/99  soft-tissue]
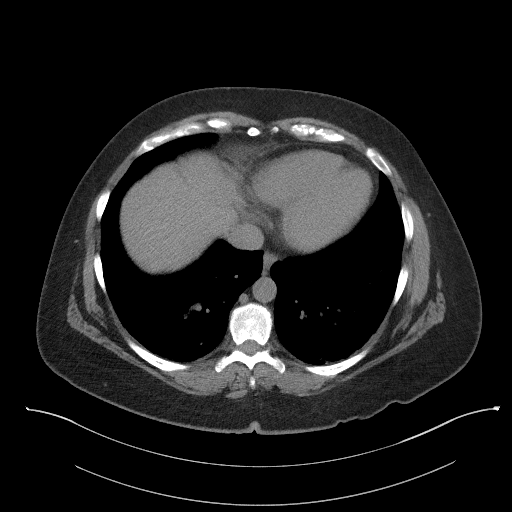

[Series 5: coronal st · coronal · 0.80mm/px · 3 of 101 slices shown]
[im 34/101  soft-tissue]
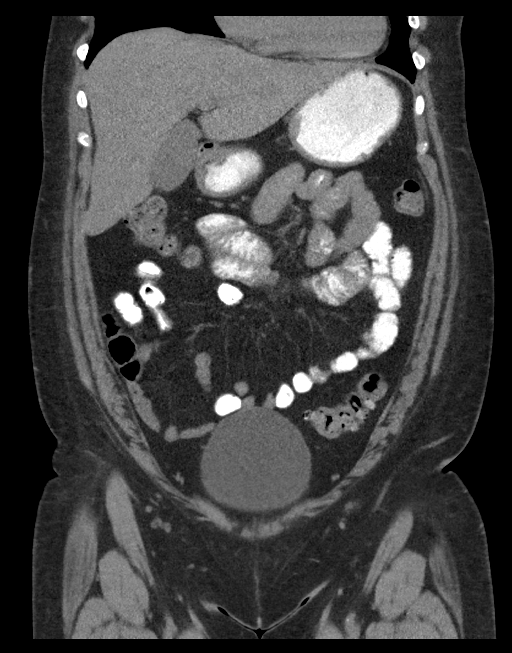
[im 45/101  soft-tissue]
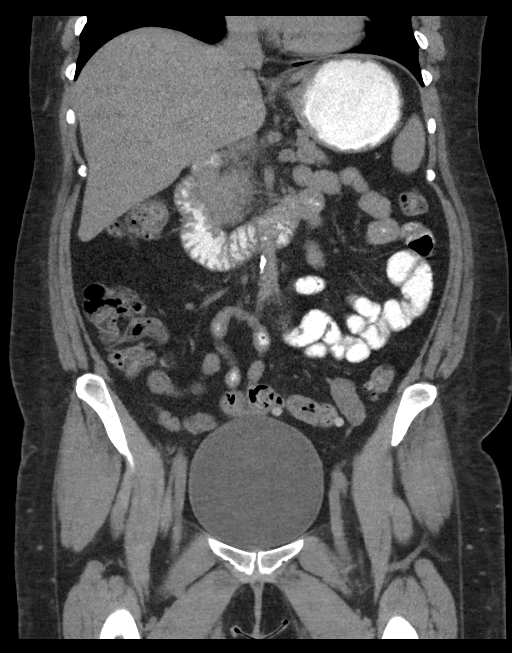
[im 56/101  soft-tissue]
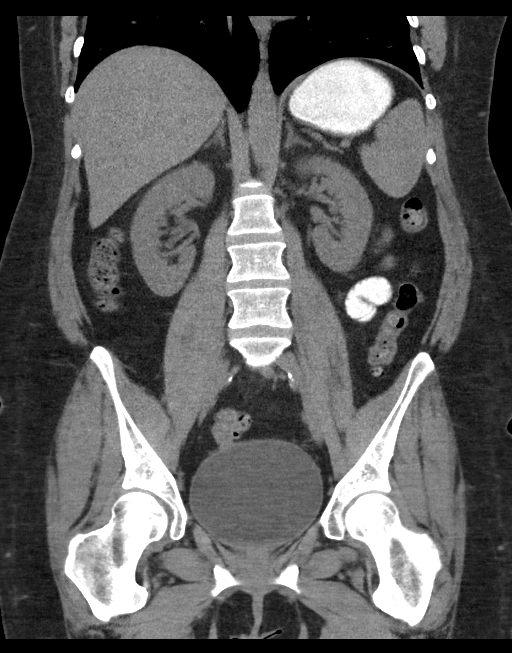

[16 of 46 positions shown; findings below may reference images not displayed]

FINDINGS: LOWER CHEST: Dependent atelectasis. The visualized heart size is
normal. No pericardial effusion.

HEPATOBILIARY: Normal.

PANCREAS: Normal.

SPLEEN: Normal.

ADRENALS/URINARY TRACT: Kidneys are orthotopic, demonstrating normal
size and morphology. No nephrolithiasis, hydronephrosis; limited
assessment for renal masses on this nonenhanced examination. The
unopacified ureters are normal in course and caliber. Urinary
bladder is partially distended and unremarkable. Normal adrenal
glands.

STOMACH/BOWEL: Trace residual versus refluxed contrast in distal
esophagus. The stomach, small and large bowel are normal in course
and caliber without inflammatory changes. Enteric contrast has not
yet reached the distal small bowel. Moderate descending and sigmoid
colonic diverticulosis.

VASCULAR/LYMPHATIC: Aortoiliac vessels are normal in course and
caliber. Mild calcific atherosclerosis. No lymphadenopathy by CT
size criteria.

REPRODUCTIVE: Status post hysterectomy.

OTHER: No intraperitoneal free fluid or free air.

MUSCULOSKELETAL: Non-acute.  Severe LEFT L5-S1 facet arthropathy.
IMPRESSION: 1. Colonic diverticulosis without acute diverticulitis nor acute
intra-abdominal/pelvic process.

Aortic Atherosclerosis ([id number]-Z3O.O).

## 2018-07-17 ENCOUNTER — Ambulatory Visit (HOSPITAL_BASED_OUTPATIENT_CLINIC_OR_DEPARTMENT_OTHER): Payer: BLUE CROSS/BLUE SHIELD

## 2018-07-20 ENCOUNTER — Other Ambulatory Visit: Payer: Self-pay

## 2018-07-20 ENCOUNTER — Ambulatory Visit: Payer: Self-pay

## 2018-07-20 ENCOUNTER — Ambulatory Visit (INDEPENDENT_AMBULATORY_CARE_PROVIDER_SITE_OTHER): Payer: BLUE CROSS/BLUE SHIELD | Admitting: Internal Medicine

## 2018-07-20 DIAGNOSIS — N760 Acute vaginitis: Secondary | ICD-10-CM | POA: Diagnosis not present

## 2018-07-20 DIAGNOSIS — Z01419 Encounter for gynecological examination (general) (routine) without abnormal findings: Secondary | ICD-10-CM | POA: Diagnosis not present

## 2018-07-20 MED ORDER — FLUCONAZOLE 150 MG PO TABS: 150.0000 mg | ORAL_TABLET | Freq: Every day | ORAL | 0 refills | Status: DC

## 2018-07-20 NOTE — Telephone Encounter (Signed)
Incoming call from  Patient stating that she feel she has an  Yeast infection.   Tried new laundry detergent  Caused yeast infection.  Onset yesterday.  Denies itching . Has   Thick white cottage cheese like discharge.  Patient request Rx. For Diflucan  150mg .  Or return phone call.   Reason for Disposition . All other vaginal symptoms  (Exception: feels like prior yeast infection, minor abrasion, mild rash < 24 hour duration, mild itching)  Answer Assessment - Initial Assessment Questions 1. SYMPTOM: "What's the main symptom you're concerned about?" (e.g., pain, itching, dryness)     dischage 2. LOCATION: "Where is the  *No Answer* located?" (e.g., inside/outside, left/right)     outsde 3. ONSET: "When did the  *No Answer*  start?"     4 days  ago 4. PAIN: "Is there any pain?" If so, ask: "How bad is it?" (Scale: 1-10; mild, moderate, severe)     Denies   Just uncomfortable 5. ITCHING: "Is there any itching?" If so, ask: "How bad is it?" (Scale: 1-10; mild, moderate, severe)    Denies 6. CAUSE: "What do you think is causing the discharge?" "Have you had the same problem before? What happened then?"     detergent 7. OTHER SYMPTOMS: "Do you have any other symptoms?" (e.g., fever, itching, vaginal bleeding, pain with urination, injury to genital area, vaginal foreign body)     denies 8. PREGNANCY: "Is there any chance you are pregnant?" "When was your last menstrual period?"     Hysterectomy  Protocols used: VAGINAL [facility]

## 2018-07-20 NOTE — Telephone Encounter (Signed)
Needs virtual visit 

## 2018-07-20 NOTE — Progress Notes (Signed)
Subjective:    Patient ID: Kylie Munoz, female    DOB: 1975/07/30, 43 y.o.   MRN: [id number]  DOS:  07/20/2018 Type of visit - description: Virtual Visit via Video Note  I connected with@ on 07/20/18 at  4:00 PM EDT by a video enabled telemedicine application and verified that I am speaking with the correct person using two identifiers.   THIS ENCOUNTER IS A VIRTUAL VISIT DUE TO COVID-19 - PATIENT WAS NOT SEEN IN THE OFFICE. PATIENT HAS CONSENTED TO VIRTUAL VISIT / TELEMEDICINE VISIT   Location of patient: home  Location of provider: office  I discussed the limitations of evaluation and management by telemedicine and the availability of in person appointments. The patient expressed understanding and agreed to proceed.  History of Present Illness: Acute visit Few days ago, she developed vaginal discharge described as a "typical" yeast infection with white, cottage cheese type of discharge. She got over-the-counter topical vaginal tablet with mild help. Would like Diflucan which worked well in the past for her   Review of Systems Denies fever chills No vaginal rash. No vaginal bleeding. No lower abdominal pain, nausea or vomiting. She is sexually active   Past Medical History:  Diagnosis Date  . Anxiety   . Anxiety and depression   . Obesity     Past Surgical History:  Procedure Laterality Date  . ABDOMINAL HYSTERECTOMY  2004   has left ovary  . CARPAL TUNNEL RELEASE Right 2009  . CESAREAN SECTION  2001  . DENTAL SURGERY  2006  . LAPAROSCOPIC APPENDECTOMY N/A 07/23/2015   Procedure: APPENDECTOMY LAPAROSCOPIC;  Surgeon: Karie Soda, MD;  Location: [facility];  Service: General;  Laterality: N/A;  . UNILATERAL SALPINGECTOMY Right 2004    Social History   Socioeconomic History  . Marital status: Divorced    Spouse name: Not on file  . Number of children: 2  . Years of education: Not on file  . Highest education level: Not on file  Occupational History  .  Occupation: Retail buyer  Social Needs  . Financial resource strain: Not on file  . Food insecurity:    Worry: Not on file    Inability: Not on file  . Transportation needs:    Medical: Not on file    Non-medical: Not on file  Tobacco Use  . Smoking status: Current Some Day Smoker    Packs/day: 0.50    Types: Cigarettes  . Smokeless tobacco: Never Used  . Tobacco comment: < 1/2 ppd  Substance and Sexual Activity  . Alcohol use: Not Currently  . Drug use: No  . Sexual activity: Not on file  Lifestyle  . Physical activity:    Days per week: Not on file    Minutes per session: Not on file  . Stress: Not on file  Relationships  . Social connections:    Talks on phone: Not on file    Gets together: Not on file    Attends religious service: Not on file    Active member of club or organization: Not on file    Attends meetings of clubs or organizations: Not on file    Relationship status: Not on file  . Intimate partner violence:    Fear of current or ex partner: Not on file    Emotionally abused: Not on file    Physically abused: Not on file    Forced sexual activity: Not on file  Other Topics Concern  . Not on file  Social History Narrative   Single mom    House   Boy [phone]'[phone], married live w/ Corrie DandyMary   Boy- Elijah Birkom 2001   Had a step daughter , does not see her       Allergies as of 07/20/2018      Reactions   Iohexol     Code: HIVES, Desc: PT had a near syncope event,developed hives & difficulty swallowing post injection of 125 ml Omni 300.Treated by Dr.McAlhany with 50 mg PO, 25mg  IV benedryl, 125 mg solu-cortef, 0.3 mg Epinephrine IM, 40 mg famotidine PO. [facility], Onset Date: [id number]   Tramadol Hcl    REACTION: itching      Medication List       Accurate as of Jul 20, 2018  4:01 PM. If you have any questions, ask your nurse or doctor.        atorvastatin 20 MG tablet Commonly known as:  LIPITOR Take 1 tablet (20 mg total) by mouth at  bedtime.   AZO-STANDARD PO Take by mouth daily.   clonazePAM 0.5 MG tablet Commonly known as:  KLONOPIN Take 1 tablet (0.5 mg total) by mouth 2 (two) times daily as needed for anxiety.   FISH OIL PO Take by mouth.   hydrOXYzine 10 MG tablet Commonly known as:  ATARAX/VISTARIL Take 1 tablet (10 mg total) by mouth 3 (three) times daily as needed for anxiety.   ibuprofen 200 MG tablet Commonly known as:  ADVIL You can take 2-3 tablets every 6 hours as needed for pain   varenicline 0.5 MG X 11 & 1 MG X 42 tablet Commonly known as:  Chantix Starting Month Pak Take one 0.5 mg tablet by mouth once daily for 3 days, then increase to one 0.5 mg tablet twice daily for 4 days, then increase to one 1 mg tablet twice daily.   varenicline 1 MG tablet Commonly known as:  Chantix Continuing Month Pak Take 1 tablet (1 mg total) by mouth 2 (two) times daily. To be dispense 1 month after the starting pack   VITAMIN C PO Take by mouth.   WOMENS ONE DAILY PO Take by mouth.           Objective:   Physical Exam LMP 03/13/2002  This was a virtual video visit, the patient was able to see me but I could not see her.  Sound was very good.  No distress    Assessment     ASSESSMENT   Anxiety and depression: years ago tried wellbutrin ("didn't feel right"), also SSRIs (didn't work for her) H/o abnormal Pap smears +FH Lung ca (mother)  PLAN Vaginitis: Suspect fungal vaginitis per patient description of symptoms, partially treated with topical OTC antifungals. Plan: Diflucan for 2 days, prescription sent, definitely call if not better Patient education: She is sexually active, if not better needs further evaluation.  Safe sex encourage Preventive care: Likes to establish with a new gynecologist, referral will be sent.   I discussed the assessment and treatment plan with the patient. The patient was provided an opportunity to ask questions and all were answered. The patient agreed with the  plan and demonstrated an understanding of the instructions.   The patient was advised to call back or seek an in-person evaluation if the symptoms worsen or if the condition fails to improve as anticipated.

## 2018-07-23 NOTE — Assessment & Plan Note (Signed)
Vaginitis: Suspect fungal vaginitis per patient description of symptoms, partially treated with topical OTC antifungals. Plan: Diflucan for 2 days, prescription sent, definitely call if not better Patient education: She is sexually active, if not better needs further evaluation.  Safe sex encourage Preventive care: Likes to establish with a new gynecologist, referral will be sent.

## 2018-07-24 ENCOUNTER — Telehealth: Payer: Self-pay | Admitting: Internal Medicine

## 2018-07-24 MED ORDER — ATORVASTATIN CALCIUM 20 MG PO TABS: 20.0000 mg | ORAL_TABLET | Freq: Every day | ORAL | 1 refills | Status: DC

## 2018-07-24 NOTE — Telephone Encounter (Signed)
Copied from CRM [id number]. Topic: Quick Communication - Rx Refill/Question >> Jul 24, 2018  8:34 AM Maia Petties wrote: Medication: atorvastatin (LIPITOR) 20 MG tablet - pt insurance said this is a maintenance medication and needs to be a 90 day supply and needs to go to a different pharmacy to avoid paying full retail price (pt had to pay $85 last month retail)  Has the patient contacted their pharmacy? Yes  new RX needed  Preferred Pharmacy (with phone number or street name): [id number] [place], [place] - [address] [phone] (Phone) [phone] (Fax)

## 2018-07-24 NOTE — Addendum Note (Signed)
Addended byConrad Moorland D on: 07/24/2018 07:50 AM   Modules accepted: Orders

## 2018-07-24 NOTE — Telephone Encounter (Signed)
Rx sent 

## 2018-08-21 ENCOUNTER — Telehealth: Payer: BLUE CROSS/BLUE SHIELD | Admitting: Cardiology

## 2018-08-22 ENCOUNTER — Encounter: Payer: Self-pay | Admitting: Cardiology

## 2018-08-22 ENCOUNTER — Other Ambulatory Visit: Payer: Self-pay

## 2018-08-22 ENCOUNTER — Telehealth: Payer: Self-pay | Admitting: Emergency Medicine

## 2018-08-22 ENCOUNTER — Telehealth (INDEPENDENT_AMBULATORY_CARE_PROVIDER_SITE_OTHER): Payer: BC Managed Care – PPO | Admitting: Cardiology

## 2018-08-22 DIAGNOSIS — F17200 Nicotine dependence, unspecified, uncomplicated: Secondary | ICD-10-CM | POA: Diagnosis not present

## 2018-08-22 DIAGNOSIS — E785 Hyperlipidemia, unspecified: Secondary | ICD-10-CM

## 2018-08-22 DIAGNOSIS — R0789 Other chest pain: Secondary | ICD-10-CM

## 2018-08-22 NOTE — Patient Instructions (Signed)
Medication Instructions:  Your physician recommends that you continue on your current medications as directed. Please refer to the Current Medication list given to you today.  If you need a refill on your cardiac medications before your next appointment, please call your pharmacy.   Lab work: None.  If you have labs (blood work) drawn today and your tests are completely normal, you will receive your results only by: . MyChart Message (if you have MyChart) OR . A paper copy in the mail If you have any lab test that is abnormal or we need to change your treatment, we will call you to review the results.  Testing/Procedures: None.    Follow-Up: At [facility], you and your health needs are our priority.  As part of our continuing mission to provide you with exceptional heart care, we have created designated Provider Care Teams.  These Care Teams include your primary Cardiologist (physician) and Advanced Practice Providers (APPs -  Physician Assistants and Nurse Practitioners) who all work together to provide you with the care you need, when you need it. You will need a follow up appointment in 3 months.  Please call our office 2 months in advance to schedule this appointment.  You may see No primary care provider on file. or another member of our [facility] Provider Team in [place]: Brian Munley, MD . Rajan Revankar, MD  Any Other Special Instructions Will Be Listed Below (If Applicable).     

## 2018-08-22 NOTE — Progress Notes (Signed)
Virtual Visit via Video Note   This visit type was conducted due to national recommendations for restrictions regarding the COVID-19 Pandemic (e.g. social distancing) in an effort to limit this patient's exposure and mitigate transmission in our community.  Due to her co-morbid illnesses, this patient is at least at moderate risk for complications without adequate follow up.  This format is felt to be most appropriate for this patient at this time.  All issues noted in this document were discussed and addressed.  A limited physical exam was performed with this format.  Please refer to the patient's chart for her consent to telehealth for [facility].  Evaluation Performed:  Follow-up visit  This visit type was conducted due to national recommendations for restrictions regarding the COVID-19 Pandemic (e.g. social distancing).  This format is felt to be most appropriate for this patient at this time.  All issues noted in this document were discussed and addressed.  No physical exam was performed (except for noted visual exam findings with Video Visits).  Please refer to the patient's chart (MyChart message for video visits and phone note for telephone visits) for the patient's consent to telehealth [facility].  Date:  08/22/2018  ID: Kylie CoteMary C Munoz, DOB 1975-04-21, MRN [id number]   Patient Location: [address] [place] [id number]   Provider location[facility]   [facility] [place] [facility]  PCP:  Wanda PlumpPaz, Jose E, MD  Cardiologist:  Gypsy Balsamobert Philomene Haff, MD     Chief Complaint: Doing very well  History of Present Illness:    Kylie Munoz is a 43 y.o. female  who presents via audio/video conferencing for a telehealth visit today.  With atypical chest pain, palpitations.  She also got some issue with drinking as well as smoking.  I am talking to her over the video link today to see how she does.overall she is doing well she is not working now she is very happy and not stressed out.  She is  enjoying herself.  Denies have any chest pain tightness squeezing pressure burning chest no palpitations overall doing well   The patient does not have symptoms concerning for COVID-19 infection (fever, chills, cough, or new SHORTNESS OF BREATH).    Prior CV studies:   The following studies were reviewed today:       Past Medical History:  Diagnosis Date  . Anxiety   . Anxiety and depression   . Obesity     Past Surgical History:  Procedure Laterality Date  . ABDOMINAL HYSTERECTOMY  2004   has left ovary  . CARPAL TUNNEL RELEASE Right 2009  . CESAREAN SECTION  2001  . DENTAL SURGERY  2006  . LAPAROSCOPIC APPENDECTOMY N/A 07/23/2015   Procedure: APPENDECTOMY LAPAROSCOPIC;  Surgeon: Karie SodaSteven Gross, MD;  Location: [facility];  Service: General;  Laterality: N/A;  . UNILATERAL SALPINGECTOMY Right 2004     Current Meds  Medication Sig  . Ascorbic Acid (VITAMIN C PO) Take by mouth.  Marland Kitchen. atorvastatin (LIPITOR) 20 MG tablet Take 1 tablet (20 mg total) by mouth at bedtime.  . clonazePAM (KLONOPIN) 0.5 MG tablet Take 1 tablet (0.5 mg total) by mouth 2 (two) times daily as needed for anxiety.  . hydrOXYzine (ATARAX/VISTARIL) 10 MG tablet Take 1 tablet (10 mg total) by mouth 3 (three) times daily as needed for anxiety.  Marland Kitchen. ibuprofen (ADVIL,MOTRIN) 200 MG tablet You can take 2-3 tablets every 6 hours as needed for pain  . Multiple Vitamins-Minerals (WOMENS ONE  DAILY PO) Take by mouth.  . Omega-3 Fatty Acids (FISH OIL PO) Take by mouth.  . Phenazopyridine HCl (AZO-STANDARD PO) Take by mouth daily.      Family History: The patient's family history includes Breast cancer in her maternal grandmother and mother; Breast cancer (age of onset: 450) in her paternal grandmother; CAD in her father; Diabetes in her father, maternal grandmother, mother, and paternal grandmother; Heart failure in her father, mother, and paternal grandmother; Hypertension in her father, maternal grandfather, maternal  grandmother, mother, paternal grandfather, and paternal grandmother; Lung cancer in her mother; Ovarian cancer in her maternal aunt and paternal aunt; Stroke in her paternal grandmother. There is no history of Colon cancer.   ROS:   Please see the history of present illness.     All other systems reviewed and are negative.   Labs/Other Tests and Data Reviewed:     Recent Labs: 05/04/2018: ALT 13; BUN 8; Creat 0.54; Hemoglobin 14.8; Platelets 255; Potassium 3.6; Sodium 138  Recent Lipid Panel    Component Value Date/Time   CHOL 262 (H) 05/04/2018 1345   TRIG 176 (H) 05/04/2018 1345   HDL 47 (L) 05/04/2018 1345   CHOLHDL 5.6 (H) 05/04/2018 1345   VLDL 29.0 08/31/2011 1110   LDLCALC 182 (H) 05/04/2018 1345   LDLDIRECT 174.7 08/31/2011 1110      Exam:    Vital Signs:  LMP 03/13/2002     Wt Readings from Last 3 Encounters:  05/10/18 218 lb 6.4 oz (99.1 kg)  05/04/18 217 lb 4 oz (98.5 kg)  04/30/18 180 lb (81.6 kg)     Well nourished, well developed in no acute distress. Alert awake in exam 3 talking to me via video link happy sitting in the car in the parking lot.  Not in any distress  Diagnosis for this visit:   1. Atypical chest pain   2. Dyslipidemia   3. Smoking      ASSESSMENT & PLAN:    1.  Atypical chest pain denies having any.  We discussed potential issue of doing stress test however she is doing so well we simply cannot wait and see how situation will evolve Dyslipidemia, on atorvastatin which I will continue.  She continue exercises on a regular basis losing some weight hopefully her cholesterol be better. 3.  Smoking still a problem I strongly advised her to quit she does have Chantix but is afraid to use it because of potential side effects I told her that side effects are very rare and she should try.  COVID-19 Education: The signs and symptoms of COVID-19 were discussed with the patient and how to seek care for testing (follow up with PCP or arrange  E-visit).  The importance of social distancing was discussed today.  Patient Risk:   After full review of this patients clinical status, I feel that they are at least moderate risk at this time.  Time:   Today, I have spent 15 minutes with the patient with telehealth technology discussing pt health issues.  I spent 5 minutes reviewing her chart before the visit.  Visit was finished at 3:45 PM.    Medication Adjustments/Labs and Tests Ordered: Current medicines are reviewed at length with the patient today.  Concerns regarding medicines are outlined above.  No orders of the defined types were placed in this encounter.  Medication changes: No orders of the defined types were placed in this encounter.    Disposition: Follow-up in 3 months  Signed, Molly MaduroRobert  Synetta Fail, MD, [facility] 08/22/2018 4:04 PM    [facility]

## 2018-08-22 NOTE — Telephone Encounter (Signed)
Left message for patient to return call she will need 3 month follow up appointment with Dr. Agustin Cree.

## 2018-08-23 ENCOUNTER — Encounter: Payer: Self-pay | Admitting: Obstetrics & Gynecology

## 2018-08-23 ENCOUNTER — Ambulatory Visit (INDEPENDENT_AMBULATORY_CARE_PROVIDER_SITE_OTHER): Payer: BC Managed Care – PPO | Admitting: Obstetrics & Gynecology

## 2018-08-23 VITALS — BP 126/88 | Ht 65.5 in | Wt 208.0 lb

## 2018-08-23 DIAGNOSIS — Z90710 Acquired absence of both cervix and uterus: Secondary | ICD-10-CM | POA: Diagnosis not present

## 2018-08-23 DIAGNOSIS — Z01419 Encounter for gynecological examination (general) (routine) without abnormal findings: Secondary | ICD-10-CM | POA: Diagnosis not present

## 2018-08-23 DIAGNOSIS — E6609 Other obesity due to excess calories: Secondary | ICD-10-CM

## 2018-08-23 DIAGNOSIS — N898 Other specified noninflammatory disorders of vagina: Secondary | ICD-10-CM

## 2018-08-23 DIAGNOSIS — Z113 Encounter for screening for infections with a predominantly sexual mode of transmission: Secondary | ICD-10-CM

## 2018-08-23 DIAGNOSIS — Z1151 Encounter for screening for human papillomavirus (HPV): Secondary | ICD-10-CM | POA: Diagnosis not present

## 2018-08-23 DIAGNOSIS — Z6834 Body mass index (BMI) 34.0-34.9, adult: Secondary | ICD-10-CM

## 2018-08-23 LAB — WET PREP FOR TRICH, YEAST, CLUE

## 2018-08-23 MED ORDER — FLUCONAZOLE 150 MG PO TABS: 150.0000 mg | ORAL_TABLET | Freq: Every day | ORAL | 3 refills | Status: AC

## 2018-08-23 NOTE — Patient Instructions (Signed)
1. Encounter for Papanicolaou smear of vagina as part of routine gynecological examination Gynecologic exam status post TAH.  Pap test with high-risk HPV done on the vaginal vault.  Breast exam normal.  Will schedule a screening mammogram now.  Health labs with family physician.  2. S/P TAH (total abdominal hysterectomy)  3. Screen for STD (sexually transmitted disease) - Gono-Chlam on Pap - HIV antibody (with reflex) - RPR - Hepatitis C Antibody - Hepatitis B Surface AntiGEN  4. Vaginal discharge Yeast vaginitis confirmed by wet prep.  Will treat with fluconazole 150 mg 1 tablet per mouth daily for 3 days.  Usage reviewed and prescription with refills sent to pharmacy. - WET PREP FOR TRICH, YEAST, CLUE  5. Class 1 obesity due to excess calories without serious comorbidity with body mass index (BMI) of 34.0 to 34.9 in adult Continue with low calorie/carb diet such as [employer].  Aerobic physical activities 5 times a week and weightlifting every 2 days.  Other orders - fluconazole (DIFLUCAN) 150 MG tablet; Take 1 tablet (150 mg total) by mouth daily for 3 days.  Liany, it was a pleasure meeting you today!  I will inform you of your results as soon as they are available.

## 2018-08-23 NOTE — Progress Notes (Signed)
Kylie Munoz 1975-08-25 [id number]   History:    43 y.o. G2P2L2 Divorced x 9 yrs.  Has a boyfriend  RP:  New (>3 years) patient presenting for annual gyn exam   HPI: S/P TAH/USO.  Increased vaginal discharge.  Frequent yeast vaginitis.  No pelvic pain.  No pain with intercourse.  Urine and bowel movements normal.  Breast normal.  Body mass index 34.09.  Has lost weight on a low calorie/carb diet.  Exercising regularly.  Health labs with family physician.  Past medical history,surgical history, family history and social history were all reviewed and documented in the EPIC chart.  Gynecologic History Patient's last menstrual period was 03/13/2002. Contraception: status post hysterectomy Last Pap: 04/2013. Results were: Negative/HPV HR neg Last mammogram: 06/2013. Results were: Negative Bone Density: Never Colonoscopy: Never  Obstetric History OB History  Gravida Para Term Preterm AB Living  2 2       2   SAB TAB Ectopic Multiple Live Births               # Outcome Date GA Lbr Len/2nd Weight Sex Delivery Anes PTL Lv  2 Para           1 Para              ROS: A ROS was performed and pertinent positives and negatives are included in the history.  GENERAL: No fevers or chills. HEENT: No change in vision, no earache, sore throat or sinus congestion. NECK: No pain or stiffness. CARDIOVASCULAR: No chest pain or pressure. No palpitations. PULMONARY: No shortness of breath, cough or wheeze. GASTROINTESTINAL: No abdominal pain, nausea, vomiting or diarrhea, melena or bright red blood per rectum. GENITOURINARY: No urinary frequency, urgency, hesitancy or dysuria. MUSCULOSKELETAL: No joint or muscle pain, no back pain, no recent trauma. DERMATOLOGIC: No rash, no itching, no lesions. ENDOCRINE: No polyuria, polydipsia, no heat or cold intolerance. No recent change in weight. HEMATOLOGICAL: No anemia or easy bruising or bleeding. NEUROLOGIC: No headache, seizures, numbness, tingling or weakness.  PSYCHIATRIC: No depression, no loss of interest in normal activity or change in sleep pattern.     Exam:   BP 126/88   Ht 5' 5.5" (1.664 m)   Wt 208 lb (94.3 kg)   LMP 03/13/2002   BMI 34.09 kg/m   Body mass index is 34.09 kg/m.  General appearance : Well developed well nourished female. No acute distress HEENT: Eyes: no retinal hemorrhage or exudates,  Neck supple, trachea midline, no carotid bruits, no thyroidmegaly Lungs: Clear to auscultation, no rhonchi or wheezes, or rib retractions  Heart: Regular rate and rhythm, no murmurs or gallops Breast:Examined in sitting and supine position were symmetrical in appearance, no palpable masses or tenderness,  no skin retraction, no nipple inversion, no nipple discharge, no skin discoloration, no axillary or supraclavicular lymphadenopathy Abdomen: no palpable masses or tenderness, no rebound or guarding Extremities: no edema or skin discoloration or tenderness  Pelvic: Vulva: Normal             Vagina: No gross lesions or discharge.  Pap/HPV HR, Gono-Chlam done.  Cervix/Uterus absent  Adnexa  Without masses or tenderness  Anus: Normal  Wet prep: Yeasts present   Assessment/Plan:  43 y.o. female for annual exam   1. Encounter for Papanicolaou smear of vagina as part of routine gynecological examination Gynecologic exam status post TAH.  Pap test with high-risk HPV done on the vaginal vault.  Breast exam normal.  Will schedule  a screening mammogram now.  Health labs with family physician.  2. S/P TAH (total abdominal hysterectomy)  3. Screen for STD (sexually transmitted disease) - Gono-Chlam on Pap - HIV antibody (with reflex) - RPR - Hepatitis C Antibody - Hepatitis B Surface AntiGEN  4. Vaginal discharge Yeast vaginitis confirmed by wet prep.  Will treat with fluconazole 150 mg 1 tablet per mouth daily for 3 days.  Usage reviewed and prescription with refills sent to pharmacy. - WET PREP FOR TRICH, YEAST, CLUE  5. Class  1 obesity due to excess calories without serious comorbidity with body mass index (BMI) of 34.0 to 34.9 in adult Continue with low calorie/carb diet such as [employer] diet.  Aerobic physical activities 5 times a week and weightlifting every 2 days.  Other orders - fluconazole (DIFLUCAN) 150 MG tablet; Take 1 tablet (150 mg total) by mouth daily for 3 days.   Genia DelMarie-Lyne Claudett Bayly MD, 2:22 PM 08/23/2018

## 2018-08-24 LAB — RPR: RPR Ser Ql: NONREACTIVE

## 2018-08-24 LAB — PAP IG, CT-NG NAA, HPV HIGH-RISK
C. trachomatis RNA, TMA: NOT DETECTED
HPV DNA High Risk: NOT DETECTED
N. gonorrhoeae RNA, TMA: NOT DETECTED

## 2018-08-24 LAB — HEPATITIS B SURFACE ANTIGEN: Hepatitis B Surface Ag: NONREACTIVE

## 2018-08-24 LAB — HEPATITIS C ANTIBODY
Hepatitis C Ab: NONREACTIVE
SIGNAL TO CUT-OFF: 0.02 (ref <1.00)

## 2018-08-24 LAB — HIV ANTIBODY (ROUTINE TESTING W REFLEX): HIV 1&2 Ab, 4th Generation: NONREACTIVE

## 2018-09-25 ENCOUNTER — Telehealth: Payer: Self-pay | Admitting: *Deleted

## 2018-09-25 NOTE — Telephone Encounter (Signed)
Agree to send 2 extra refills.

## 2018-09-25 NOTE — Telephone Encounter (Signed)
Patient called to report she just lost her job and will lose insurance on 09/28/18. She has 2 refills on diflucan 150 mg tablet #3 tablets left from Rx on OV 08/23/18, asked if 2 more could be sent to pharmacy to have on hand? Please advise

## 2018-09-26 MED ORDER — FLUCONAZOLE 150 MG PO TABS: 150.0000 mg | ORAL_TABLET | Freq: Every day | ORAL | 1 refills | Status: DC

## 2018-09-26 NOTE — Addendum Note (Signed)
Addended by: Thamas Jaegers on: 09/26/2018 08:17 AM   Modules accepted: Orders

## 2018-09-26 NOTE — Telephone Encounter (Signed)
Patient aware, Rx sent.  

## 2018-10-04 ENCOUNTER — Telehealth: Payer: Self-pay | Admitting: Internal Medicine

## 2018-10-04 MED ORDER — ATORVASTATIN CALCIUM 20 MG PO TABS: 20.0000 mg | ORAL_TABLET | Freq: Every day | ORAL | 1 refills | Status: DC

## 2018-10-04 NOTE — Telephone Encounter (Signed)
Patient called to cancel her appointment due to lack of insurance after losing her job. Patient is requesting a 90 day refill of Lipitor to be [facility] [place] [facility] [address].

## 2018-10-04 NOTE — Telephone Encounter (Signed)
Rx sent 

## 2018-10-05 ENCOUNTER — Ambulatory Visit: Payer: BLUE CROSS/BLUE SHIELD | Admitting: Internal Medicine

## 2018-10-26 IMAGING — CR DG CHEST 2V
2 series · 2 of 2 positions shown · non-contrast
Comparison: PA and lateral chest x-ray February 26, 2015.

CLINICAL DATA: Chest pain and pressure sensation for the past 3
days. Current smoker.

EXAM:
CHEST - 2 VIEW

[w chest pa]
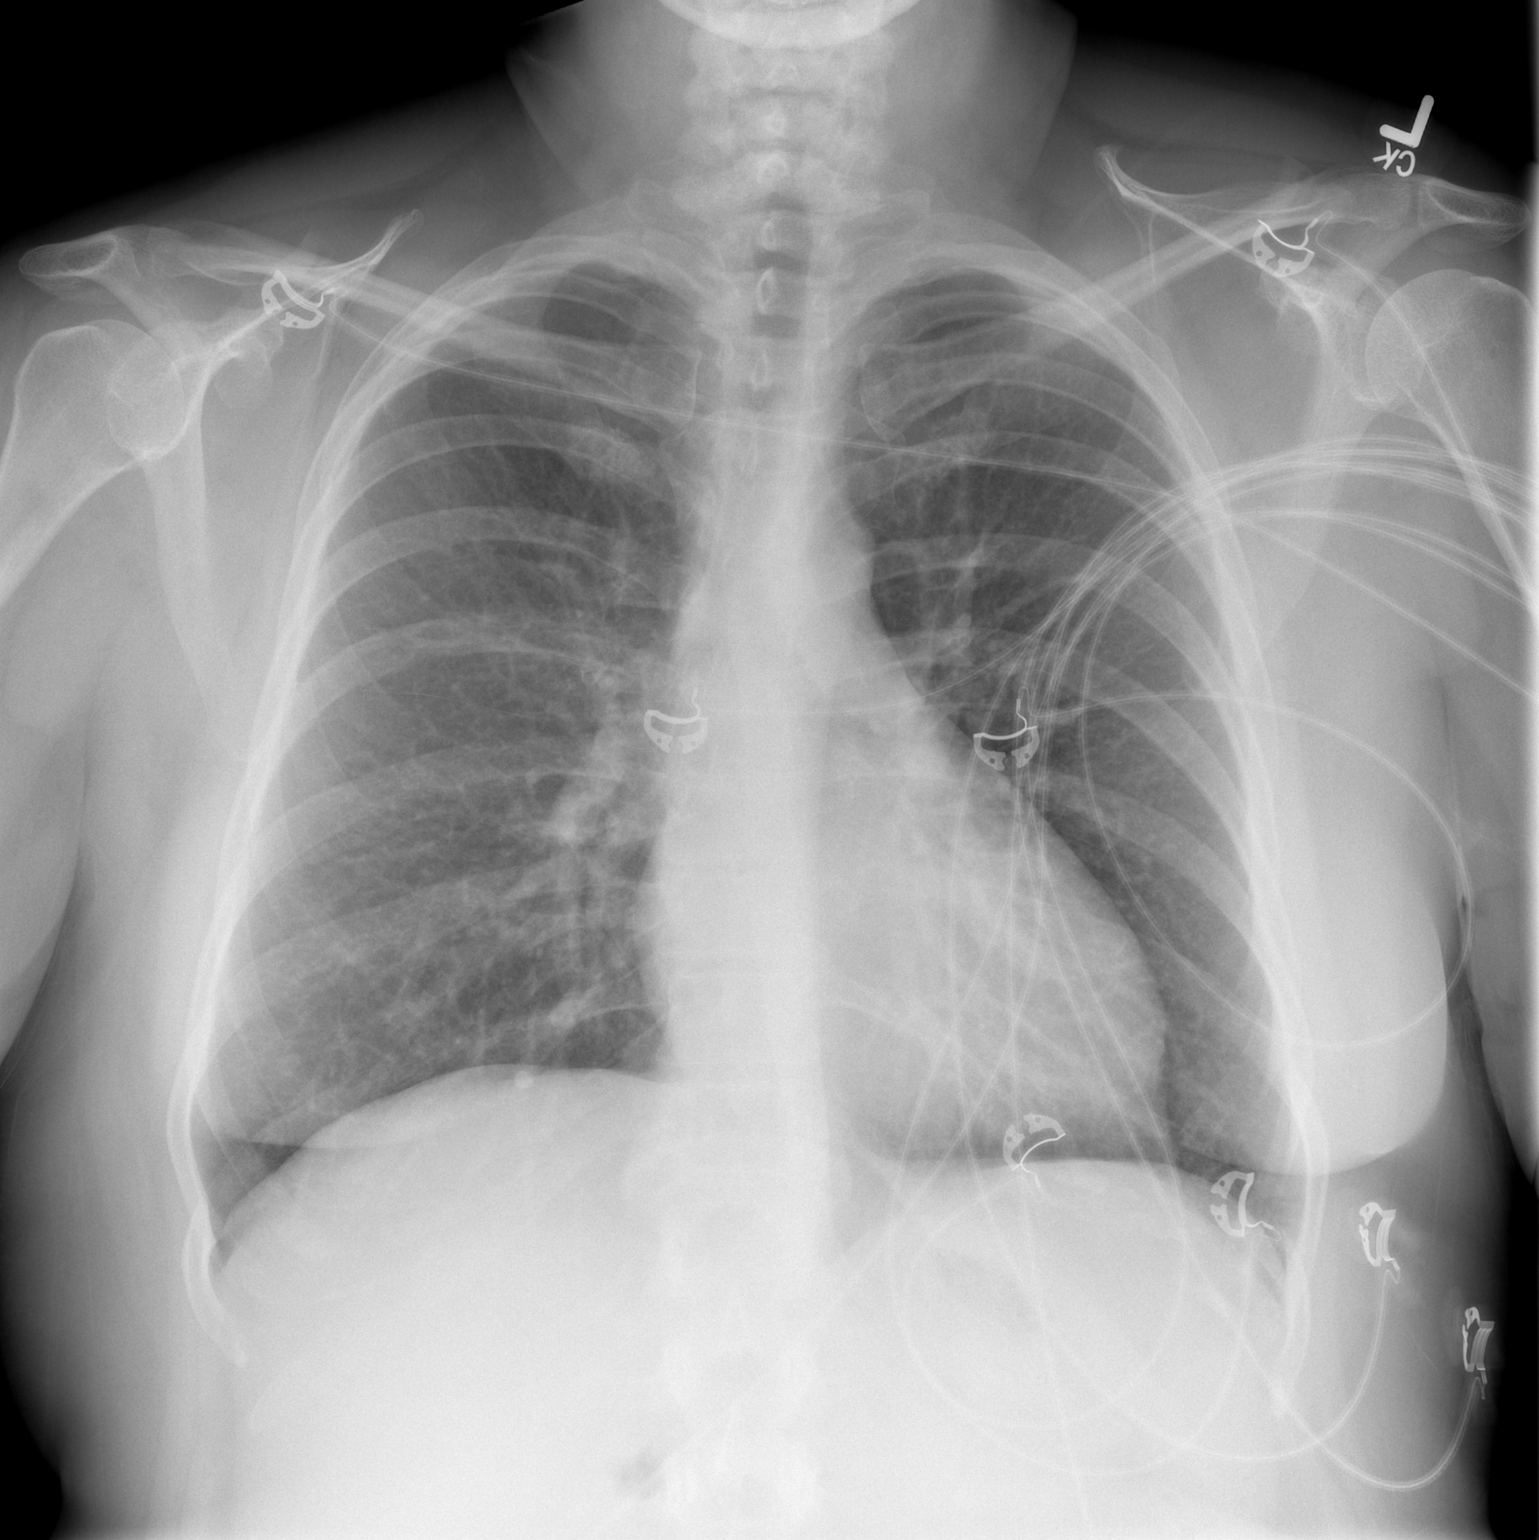

[w chest lat]
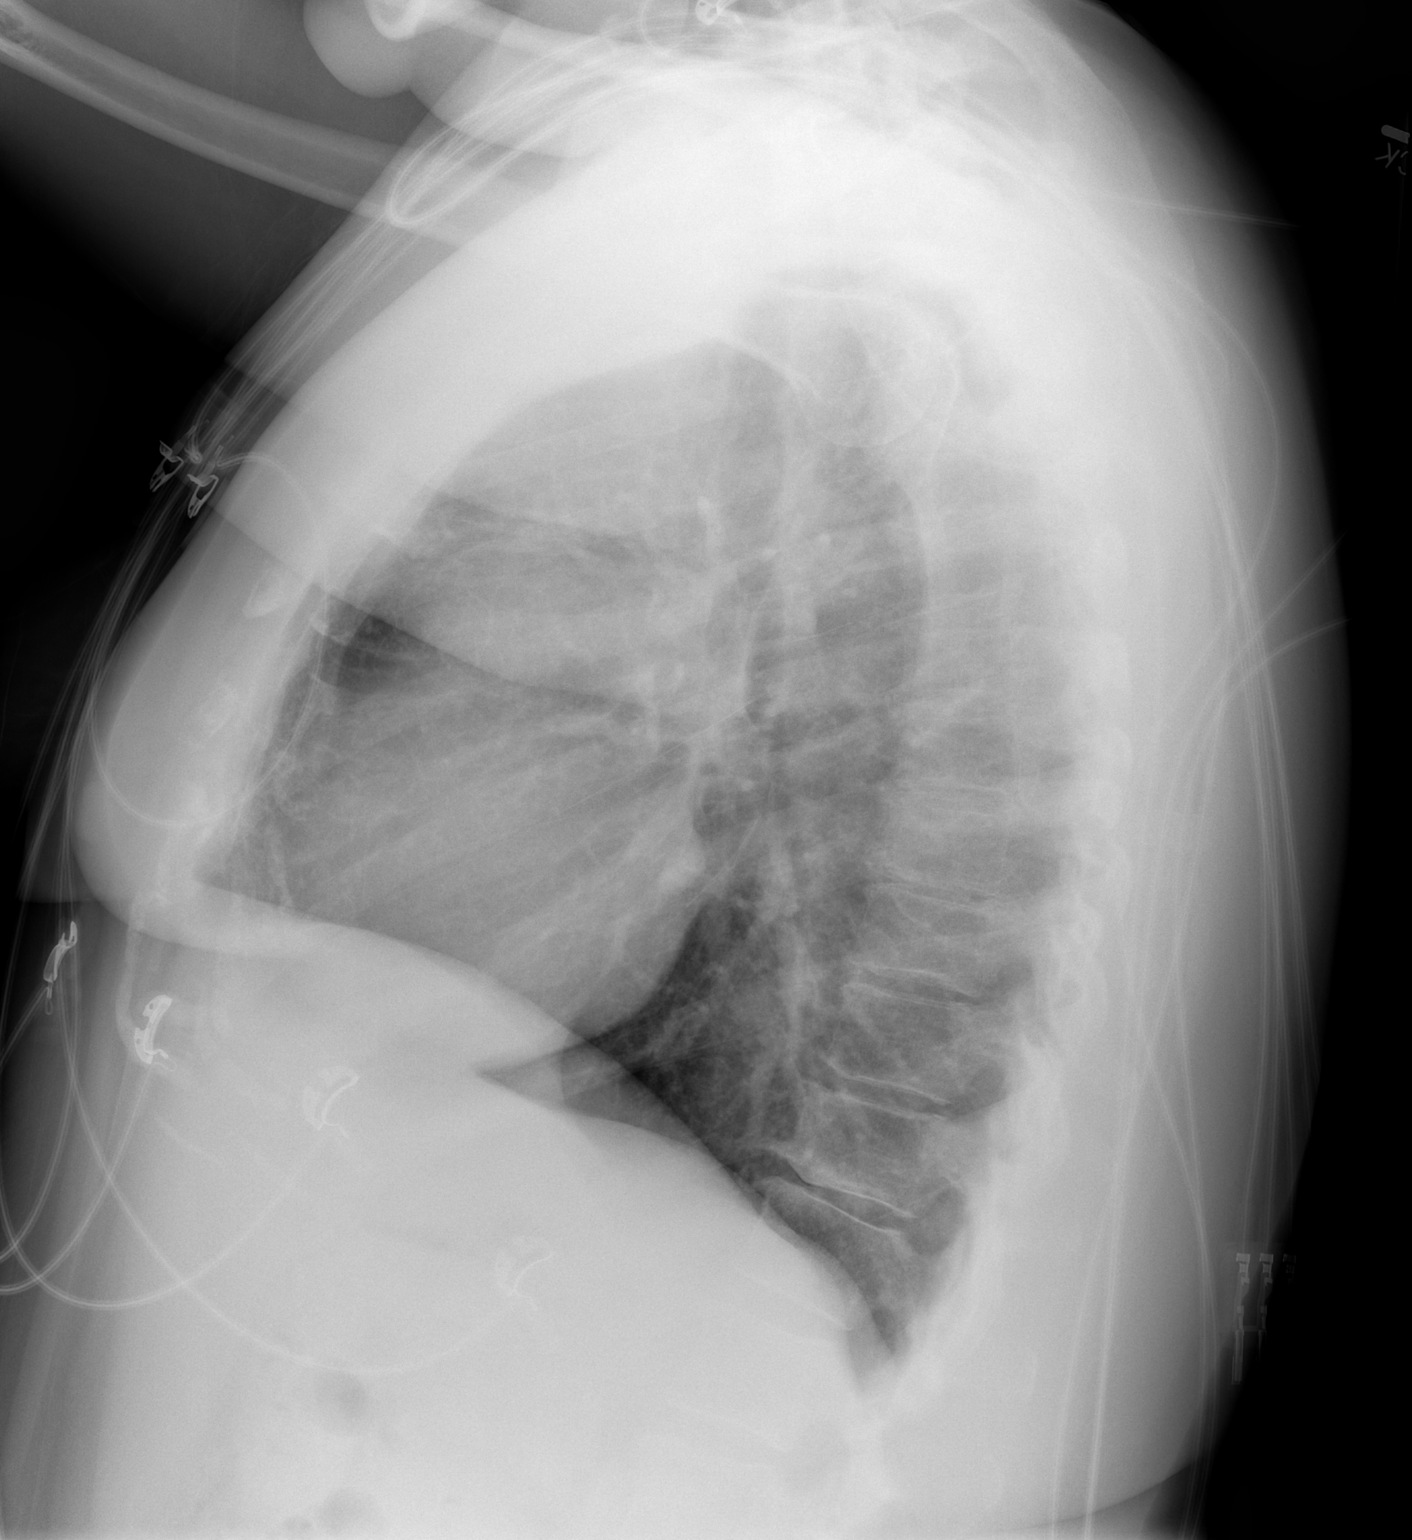

[2 of 2 positions shown; findings below may reference images not displayed]

FINDINGS: The lungs are well-expanded. The interstitial markings are mildly
prominent though stable. The heart and pulmonary vascularity are
normal. The mediastinum is normal in width. The trachea is midline.
The bony thorax exhibits no acute abnormality.
IMPRESSION: Mild chronic bronchitic-smoking related changes. No pneumonia, CHF,
nor other acute cardiopulmonary abnormality.

## 2018-12-11 ENCOUNTER — Telehealth: Payer: Self-pay | Admitting: *Deleted

## 2018-12-11 NOTE — Telephone Encounter (Signed)
Patient called requesting refill on Diflucan tablets, history of recurrent yeast infection. Please advise

## 2018-12-13 MED ORDER — FLUCONAZOLE 150 MG PO TABS: 150.0000 mg | ORAL_TABLET | Freq: Every day | ORAL | 0 refills | Status: AC

## 2018-12-13 NOTE — Telephone Encounter (Signed)
Agree to refill Diflucan.

## 2018-12-13 NOTE — Telephone Encounter (Signed)
Patient aware, Rx sent.  

## 2019-05-27 ENCOUNTER — Other Ambulatory Visit: Payer: Self-pay | Admitting: Internal Medicine

## 2020-05-07 ENCOUNTER — Encounter: Payer: Self-pay | Admitting: Internal Medicine

## 2021-04-28 ENCOUNTER — Encounter: Payer: Self-pay | Admitting: Internal Medicine
